# Patient Record
Sex: Female | Born: 1945 | Hispanic: No
Health system: Midwestern US, Community
[De-identification: ages and names within clinical notes are randomized; demographics above are authoritative.]

## PROBLEM LIST (undated history)

## (undated) DIAGNOSIS — M1990 Unspecified osteoarthritis, unspecified site: Secondary | ICD-10-CM

## (undated) DIAGNOSIS — F32A Depression, unspecified: Secondary | ICD-10-CM

## (undated) DIAGNOSIS — R7303 Prediabetes: Secondary | ICD-10-CM

## (undated) DIAGNOSIS — G4730 Sleep apnea, unspecified: Secondary | ICD-10-CM

## (undated) DIAGNOSIS — J45909 Unspecified asthma, uncomplicated: Secondary | ICD-10-CM

## (undated) DIAGNOSIS — E039 Hypothyroidism, unspecified: Secondary | ICD-10-CM

## (undated) DIAGNOSIS — M810 Age-related osteoporosis without current pathological fracture: Secondary | ICD-10-CM

## (undated) DIAGNOSIS — K219 Gastro-esophageal reflux disease without esophagitis: Secondary | ICD-10-CM

## (undated) DIAGNOSIS — F329 Major depressive disorder, single episode, unspecified: Secondary | ICD-10-CM

## (undated) DIAGNOSIS — I10 Essential (primary) hypertension: Secondary | ICD-10-CM

## (undated) DIAGNOSIS — F419 Anxiety disorder, unspecified: Secondary | ICD-10-CM

## (undated) DIAGNOSIS — E079 Disorder of thyroid, unspecified: Secondary | ICD-10-CM

## (undated) DIAGNOSIS — Z8489 Family history of other specified conditions: Secondary | ICD-10-CM

## (undated) DIAGNOSIS — N390 Urinary tract infection, site not specified: Secondary | ICD-10-CM

## (undated) HISTORY — DX: Disorder of thyroid, unspecified: E07.9

## (undated) HISTORY — DX: Essential (primary) hypertension: I10

## (undated) HISTORY — PX: JOINT REPLACEMENT: SHX530

## (undated) HISTORY — PX: BREAST BIOPSY: SHX20

## (undated) HISTORY — PX: EYE SURGERY: SHX253

## (undated) HISTORY — PX: KNEE SURGERY: SHX244

## (undated) HISTORY — DX: Major depressive disorder, single episode, unspecified: F32.9

## (undated) HISTORY — DX: Depression, unspecified: F32.A

## (undated) HISTORY — PX: CARPAL TUNNEL RELEASE: SHX101

## (undated) HISTORY — DX: Age-related osteoporosis without current pathological fracture: M81.0

## (undated) HISTORY — DX: Unspecified osteoarthritis, unspecified site: M19.90

## (undated) HISTORY — DX: Gastro-esophageal reflux disease without esophagitis: K21.9

---

## 1973-12-18 HISTORY — PX: PARTIAL HYSTERECTOMY: SHX80

## 2007-09-09 ENCOUNTER — Ambulatory Visit: Payer: Self-pay | Admitting: Internal Medicine

## 2011-01-02 LAB — BASIC METABOLIC PANEL
BUN: 10 mg/dL (ref 6–23)
CO2: 26 mEq/L (ref 19–32)
Calcium: 9 mg/dL (ref 8.4–10.5)
Chloride: 102 mEq/L (ref 96–112)
Creatinine, Ser: 0.95 mg/dL (ref 0.4–1.2)
GFR calc Af Amer: >60 mL/min (ref >60)
GFR calc non Af Amer: 59 mL/min — ABNORMAL LOW (ref >60)
Glucose, Bld: 99 mg/dL (ref 70–99)
Potassium: 3.9 mEq/L (ref 3.5–5.1)
Sodium: 137 mEq/L (ref 135–145)

## 2011-01-02 LAB — SURGICAL PCR SCREEN
MRSA, PCR: NEGATIVE
Staphylococcus aureus: NEGATIVE

## 2011-01-02 LAB — HEMOGLOBIN AND HEMATOCRIT, BLOOD
HCT: 37.1 % (ref 36.0–46.0)
Hemoglobin: 13.1 g/dL (ref 12.0–15.0)

## 2011-01-05 ENCOUNTER — Ambulatory Visit (HOSPITAL_COMMUNITY)
Admission: RE | Admit: 2011-01-05 | Discharge: 2011-01-05 | Payer: Self-pay | Source: Home / Self Care | Attending: Podiatry | Admitting: Podiatry

## 2011-02-11 NOTE — Op Note (Signed)
  NAME:  Sherri Sandoval, Sherri Sandoval          ACCOUNT NO.:  [id number]  MEDICAL RECORD NO.:  [id number]          PATIENT TYPE:  AMB  LOCATION:  DAY                           FACILITY:  [facility]  PHYSICIAN:  B. Theola Sequin, MD   DATE OF BIRTH:  [birth year]  DATE OF PROCEDURE:  01/13/2011 DATE OF DISCHARGE:  01/05/2011                              OPERATIVE REPORT   SURGEON:  B. Theola Sequin, MD  ASSISTANT:  None.  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  PROCEDURE:  Retrocalcaneal exostectomy of the left lower extremity.  ANESTHESIA:  General.  HEMOSTASIS:  Pneumatic thigh tourniquet at 300 mmHg.  ESTIMATED BLOOD LOSS:  Minimal (less than 5 mL).  PATHOLOGY:  Bone/exostosis from the posterior aspect of the calcaneus, left foot.  COMPLICATIONS:  None.  PROCEDURE IN DETAIL:  The patient was brought to the operating room. After general anesthesia was obtained, she was placed in a modified prone position. Pneumatic thigh tourniquet was placed about the patient's left thigh.  The foot was scrubbed, prepped and draped in usual sterile manner.  The limb was then elevated, exsanguinated.  The pneumatic thigh tourniquet inflated to 300 mmHg.  A linear longitudinal incision was made along the posterior aspect of the left lower extremity.  Incision was continued deep down to the level of the Achilles tendon.  The was thickening of the Achilles tendon along its insertion point to the posterior aspect of the calcaneus.  A longitudinal incision was made in the central part of the incision and this was reflected, thus exposing the retrocalcaneal exostosis at the operative site.  A intra- tendinous area of ossification was noted and this was removed and sent to Pathology for evaluation.  Wound was irrigated with copious amounts of sterile irrigant.  The prominent posterior spur  was resected using  a power bone saw and smoothed with a power rasp. The deformity was assessed with C-arm fluoroscopy.  An Arthrex 3.5-mm corkscrew soft tissue anchors was inserted along the posterior aspect of the calcaneus.  Corresponding suture from the soft tissue anchor was used to secure the Achilles tendon to the posterior aspect of the calcaneus.  The wound was again irrigated with copious amounts of sterile irrigant.  Peritenon and subcutaneous structure were reapproximated using Vicryl.  Skin was reapproximated using skin staples.  Sterile compressive dressing was applied to the left lower extremity and pneumatic thigh tourniquet was deflated and prompt hyperemic response was noted to all digits of the left foot.  A posterior splint was applied to the left lower extremity.          ______________________________ B. Theola Sequin, MD     BIM/MEDQ  D:  01/13/2011  T:  01/14/2011  Job:  [id number]  Electronically Signed by Rexford Maus  on 02/11/2011 11:12:54 AM

## 2011-05-02 NOTE — Assessment & Plan Note (Signed)
[facility]                             PULMONARY OFFICE NOTE   Sherri Sandoval, Sherri Sandoval                   MRN:          [id number]  DATE:09/09/2007                            DOB:          May 16, 1946    A 65 year old white female with morbid obesity and new-onset cough in  2005.  Ever since her initial exacerbation, which led to  hospitalization, she has had coughing spells pretty much ever day.  These occur immediately on lying down, but are also off and on during  the day and are more dry than wet.  On her previous hospitalization in  2005, she was actually diagnosed with asthma, but after she ran out of  Advair found that her symptoms were no better and no worse.  She has  been treated with a course of prednisone at this point for her most  recent exacerbation, which started a week ago, but is feeling no  better.  She denies any excess sputum production, fevers, chills,  sweats, overt sinus or reflux symptoms.   PAST MEDICAL HISTORY:  Significant for obesity complicated by  hypertension, hyperlipidemia, and sleep apnea.  She is status post  remote hysterectomy.   ALLERGIES:  None known.   MEDICATIONS:  Advair.  Flonase.  Exforge.  Simvastatin.   SOCIAL HISTORY:  She has never smoked.  She does clerical work.   FAMILY HISTORY:  Significant for allergies in her sister.   REVIEW OF SYSTEMS:  Taken in detail and significant for the problems as  outlined above.  She does note occasional dysphagia.  Occasionally does  have acid reflux symptoms, but does not correlate these with her cough.   PHYSICAL EXAMINATION:  This is a hoarse ambulatory white female in no  acute distress.  She is afebrile with stable vital signs.  HEENT:  Unremarkable.  Oropharynx clear.  NECK:  Supple without cervical adenopathy or tenderness.  Trachea is  midline.  No thyromegaly.  No palpable thyroid nodules.  LUNGS:  The lung fields are perfectly clear bilaterally to  auscultation  and percussion.  CARDIAC:  Regular rate and rhythm without murmur, gallop, or rub.  ABDOMEN:  Soft and benign.  EXTREMITIES:  Warm without calf tenderness, cyanosis, clubbing, or  edema.   A CT scan of the chest was reviewed from August 30, 2007 and shows a  small hiatal hernia, and also a 1 cm nodule in the left lower lobe of  the thyroid gland.   IMPRESSION:  Classic upper airway cough syndrome, probably related to  reflux, which she does have overtly, although has not connected the dots  in terms of cause and effect.  At this point, I therefore recommended  rigorous treatment directed at reflux in the form of Zegerid 40 mg  nightly.  Stop the Advair (since she did not seem to respond to it and  did not previously flare off of it), and reviewed with her very  carefully the intent of a gastroesophageal reflux disease diet.   If she is still coughing, I recommended she take Delsym 2 teaspoon every  12 hours supplemented with tramadol  every 4 hours.  If this totally  eliminates her symptoms, then she can certainly get the refills there in  [place].  Otherwise, I need to see her back here in 4 weeks.     Charlaine Dalton. Sherene Sires, MD, [facility]  Electronically Signed    MBW/MedQ  DD: 09/09/2007  DT: 09/10/2007  Job #: [id number]   cc:   Birdie Sons. Ike Bene, M.D.

## 2011-12-27 DIAGNOSIS — Z780 Asymptomatic menopausal state: Secondary | ICD-10-CM | POA: Diagnosis not present

## 2011-12-27 DIAGNOSIS — N8111 Cystocele, midline: Secondary | ICD-10-CM | POA: Diagnosis not present

## 2011-12-27 DIAGNOSIS — E669 Obesity, unspecified: Secondary | ICD-10-CM | POA: Diagnosis not present

## 2011-12-27 DIAGNOSIS — Z1289 Encounter for screening for malignant neoplasm of other sites: Secondary | ICD-10-CM | POA: Diagnosis not present

## 2011-12-27 DIAGNOSIS — N952 Postmenopausal atrophic vaginitis: Secondary | ICD-10-CM | POA: Diagnosis not present

## 2011-12-27 DIAGNOSIS — Z1212 Encounter for screening for malignant neoplasm of rectum: Secondary | ICD-10-CM | POA: Diagnosis not present

## 2012-01-23 DIAGNOSIS — E039 Hypothyroidism, unspecified: Secondary | ICD-10-CM | POA: Diagnosis not present

## 2012-01-23 DIAGNOSIS — I10 Essential (primary) hypertension: Secondary | ICD-10-CM | POA: Diagnosis not present

## 2012-01-23 DIAGNOSIS — E7800 Pure hypercholesterolemia, unspecified: Secondary | ICD-10-CM | POA: Diagnosis not present

## 2012-01-23 DIAGNOSIS — R7309 Other abnormal glucose: Secondary | ICD-10-CM | POA: Diagnosis not present

## 2012-01-30 DIAGNOSIS — E669 Obesity, unspecified: Secondary | ICD-10-CM | POA: Diagnosis not present

## 2012-01-30 DIAGNOSIS — G2581 Restless legs syndrome: Secondary | ICD-10-CM | POA: Diagnosis not present

## 2012-01-30 DIAGNOSIS — J45902 Unspecified asthma with status asthmaticus: Secondary | ICD-10-CM | POA: Diagnosis not present

## 2012-01-30 DIAGNOSIS — R609 Edema, unspecified: Secondary | ICD-10-CM | POA: Diagnosis not present

## 2012-01-30 DIAGNOSIS — K219 Gastro-esophageal reflux disease without esophagitis: Secondary | ICD-10-CM | POA: Diagnosis not present

## 2012-01-30 DIAGNOSIS — M129 Arthropathy, unspecified: Secondary | ICD-10-CM | POA: Diagnosis not present

## 2012-01-30 DIAGNOSIS — E039 Hypothyroidism, unspecified: Secondary | ICD-10-CM | POA: Diagnosis not present

## 2012-01-30 DIAGNOSIS — I10 Essential (primary) hypertension: Secondary | ICD-10-CM | POA: Diagnosis not present

## 2012-02-15 DIAGNOSIS — R143 Flatulence: Secondary | ICD-10-CM | POA: Diagnosis not present

## 2012-02-15 DIAGNOSIS — K219 Gastro-esophageal reflux disease without esophagitis: Secondary | ICD-10-CM | POA: Diagnosis not present

## 2012-02-15 DIAGNOSIS — R141 Gas pain: Secondary | ICD-10-CM | POA: Diagnosis not present

## 2012-02-15 DIAGNOSIS — Z86010 Personal history of colonic polyps: Secondary | ICD-10-CM | POA: Diagnosis not present

## 2012-02-15 DIAGNOSIS — K5909 Other constipation: Secondary | ICD-10-CM | POA: Diagnosis not present

## 2012-04-02 DIAGNOSIS — R142 Eructation: Secondary | ICD-10-CM | POA: Diagnosis not present

## 2012-04-02 DIAGNOSIS — Z86010 Personal history of colonic polyps: Secondary | ICD-10-CM | POA: Diagnosis not present

## 2012-04-02 DIAGNOSIS — R141 Gas pain: Secondary | ICD-10-CM | POA: Diagnosis not present

## 2012-04-02 DIAGNOSIS — K5909 Other constipation: Secondary | ICD-10-CM | POA: Diagnosis not present

## 2012-04-02 DIAGNOSIS — K219 Gastro-esophageal reflux disease without esophagitis: Secondary | ICD-10-CM | POA: Diagnosis not present

## 2012-04-15 DIAGNOSIS — Z961 Presence of intraocular lens: Secondary | ICD-10-CM | POA: Diagnosis not present

## 2012-04-15 DIAGNOSIS — H40019 Open angle with borderline findings, low risk, unspecified eye: Secondary | ICD-10-CM | POA: Diagnosis not present

## 2012-04-15 DIAGNOSIS — H26499 Other secondary cataract, unspecified eye: Secondary | ICD-10-CM | POA: Diagnosis not present

## 2012-04-18 DIAGNOSIS — L280 Lichen simplex chronicus: Secondary | ICD-10-CM | POA: Diagnosis not present

## 2012-04-18 DIAGNOSIS — L719 Rosacea, unspecified: Secondary | ICD-10-CM | POA: Diagnosis not present

## 2012-04-18 DIAGNOSIS — D235 Other benign neoplasm of skin of trunk: Secondary | ICD-10-CM | POA: Diagnosis not present

## 2012-07-22 DIAGNOSIS — S13161A Dislocation of C5/C6 cervical vertebrae, initial encounter: Secondary | ICD-10-CM | POA: Diagnosis not present

## 2012-07-22 DIAGNOSIS — S332XXA Dislocation of sacroiliac and sacrococcygeal joint, initial encounter: Secondary | ICD-10-CM | POA: Diagnosis not present

## 2012-07-22 DIAGNOSIS — M999 Biomechanical lesion, unspecified: Secondary | ICD-10-CM | POA: Diagnosis not present

## 2012-07-22 DIAGNOSIS — M9981 Other biomechanical lesions of cervical region: Secondary | ICD-10-CM | POA: Diagnosis not present

## 2012-07-24 DIAGNOSIS — E782 Mixed hyperlipidemia: Secondary | ICD-10-CM | POA: Diagnosis not present

## 2012-07-24 DIAGNOSIS — I10 Essential (primary) hypertension: Secondary | ICD-10-CM | POA: Diagnosis not present

## 2012-07-24 DIAGNOSIS — E039 Hypothyroidism, unspecified: Secondary | ICD-10-CM | POA: Diagnosis not present

## 2012-07-29 DIAGNOSIS — S13161A Dislocation of C5/C6 cervical vertebrae, initial encounter: Secondary | ICD-10-CM | POA: Diagnosis not present

## 2012-07-29 DIAGNOSIS — M999 Biomechanical lesion, unspecified: Secondary | ICD-10-CM | POA: Diagnosis not present

## 2012-07-29 DIAGNOSIS — S332XXA Dislocation of sacroiliac and sacrococcygeal joint, initial encounter: Secondary | ICD-10-CM | POA: Diagnosis not present

## 2012-07-29 DIAGNOSIS — M9981 Other biomechanical lesions of cervical region: Secondary | ICD-10-CM | POA: Diagnosis not present

## 2012-07-31 DIAGNOSIS — G2581 Restless legs syndrome: Secondary | ICD-10-CM | POA: Diagnosis not present

## 2012-07-31 DIAGNOSIS — M129 Arthropathy, unspecified: Secondary | ICD-10-CM | POA: Diagnosis not present

## 2012-07-31 DIAGNOSIS — I10 Essential (primary) hypertension: Secondary | ICD-10-CM | POA: Diagnosis not present

## 2012-07-31 DIAGNOSIS — E669 Obesity, unspecified: Secondary | ICD-10-CM | POA: Diagnosis not present

## 2012-07-31 DIAGNOSIS — E039 Hypothyroidism, unspecified: Secondary | ICD-10-CM | POA: Diagnosis not present

## 2012-07-31 DIAGNOSIS — K219 Gastro-esophageal reflux disease without esophagitis: Secondary | ICD-10-CM | POA: Diagnosis not present

## 2012-07-31 DIAGNOSIS — R609 Edema, unspecified: Secondary | ICD-10-CM | POA: Diagnosis not present

## 2012-07-31 DIAGNOSIS — J45902 Unspecified asthma with status asthmaticus: Secondary | ICD-10-CM | POA: Diagnosis not present

## 2012-08-01 DIAGNOSIS — S13161A Dislocation of C5/C6 cervical vertebrae, initial encounter: Secondary | ICD-10-CM | POA: Diagnosis not present

## 2012-08-01 DIAGNOSIS — S332XXA Dislocation of sacroiliac and sacrococcygeal joint, initial encounter: Secondary | ICD-10-CM | POA: Diagnosis not present

## 2012-08-01 DIAGNOSIS — M999 Biomechanical lesion, unspecified: Secondary | ICD-10-CM | POA: Diagnosis not present

## 2012-08-01 DIAGNOSIS — M9981 Other biomechanical lesions of cervical region: Secondary | ICD-10-CM | POA: Diagnosis not present

## 2012-08-07 DIAGNOSIS — M9981 Other biomechanical lesions of cervical region: Secondary | ICD-10-CM | POA: Diagnosis not present

## 2012-08-07 DIAGNOSIS — M999 Biomechanical lesion, unspecified: Secondary | ICD-10-CM | POA: Diagnosis not present

## 2012-08-07 DIAGNOSIS — S332XXA Dislocation of sacroiliac and sacrococcygeal joint, initial encounter: Secondary | ICD-10-CM | POA: Diagnosis not present

## 2012-08-07 DIAGNOSIS — S13161A Dislocation of C5/C6 cervical vertebrae, initial encounter: Secondary | ICD-10-CM | POA: Diagnosis not present

## 2012-08-08 DIAGNOSIS — M9981 Other biomechanical lesions of cervical region: Secondary | ICD-10-CM | POA: Diagnosis not present

## 2012-08-08 DIAGNOSIS — S13161A Dislocation of C5/C6 cervical vertebrae, initial encounter: Secondary | ICD-10-CM | POA: Diagnosis not present

## 2012-08-08 DIAGNOSIS — M999 Biomechanical lesion, unspecified: Secondary | ICD-10-CM | POA: Diagnosis not present

## 2012-08-08 DIAGNOSIS — S332XXA Dislocation of sacroiliac and sacrococcygeal joint, initial encounter: Secondary | ICD-10-CM | POA: Diagnosis not present

## 2012-08-12 DIAGNOSIS — M999 Biomechanical lesion, unspecified: Secondary | ICD-10-CM | POA: Diagnosis not present

## 2012-08-12 DIAGNOSIS — S332XXA Dislocation of sacroiliac and sacrococcygeal joint, initial encounter: Secondary | ICD-10-CM | POA: Diagnosis not present

## 2012-08-12 DIAGNOSIS — M9981 Other biomechanical lesions of cervical region: Secondary | ICD-10-CM | POA: Diagnosis not present

## 2012-08-12 DIAGNOSIS — S13161A Dislocation of C5/C6 cervical vertebrae, initial encounter: Secondary | ICD-10-CM | POA: Diagnosis not present

## 2012-08-22 DIAGNOSIS — M999 Biomechanical lesion, unspecified: Secondary | ICD-10-CM | POA: Diagnosis not present

## 2012-08-22 DIAGNOSIS — M9981 Other biomechanical lesions of cervical region: Secondary | ICD-10-CM | POA: Diagnosis not present

## 2012-08-22 DIAGNOSIS — S13161A Dislocation of C5/C6 cervical vertebrae, initial encounter: Secondary | ICD-10-CM | POA: Diagnosis not present

## 2012-08-22 DIAGNOSIS — S332XXA Dislocation of sacroiliac and sacrococcygeal joint, initial encounter: Secondary | ICD-10-CM | POA: Diagnosis not present

## 2012-08-28 DIAGNOSIS — M999 Biomechanical lesion, unspecified: Secondary | ICD-10-CM | POA: Diagnosis not present

## 2012-08-28 DIAGNOSIS — S13161A Dislocation of C5/C6 cervical vertebrae, initial encounter: Secondary | ICD-10-CM | POA: Diagnosis not present

## 2012-08-28 DIAGNOSIS — S332XXA Dislocation of sacroiliac and sacrococcygeal joint, initial encounter: Secondary | ICD-10-CM | POA: Diagnosis not present

## 2012-08-28 DIAGNOSIS — M9981 Other biomechanical lesions of cervical region: Secondary | ICD-10-CM | POA: Diagnosis not present

## 2012-09-03 DIAGNOSIS — J309 Allergic rhinitis, unspecified: Secondary | ICD-10-CM | POA: Diagnosis not present

## 2012-09-03 DIAGNOSIS — G4733 Obstructive sleep apnea (adult) (pediatric): Secondary | ICD-10-CM | POA: Diagnosis not present

## 2012-09-03 DIAGNOSIS — G2581 Restless legs syndrome: Secondary | ICD-10-CM | POA: Diagnosis not present

## 2012-09-03 DIAGNOSIS — Z23 Encounter for immunization: Secondary | ICD-10-CM | POA: Diagnosis not present

## 2012-09-03 DIAGNOSIS — G4700 Insomnia, unspecified: Secondary | ICD-10-CM | POA: Diagnosis not present

## 2012-09-03 DIAGNOSIS — R062 Wheezing: Secondary | ICD-10-CM | POA: Diagnosis not present

## 2012-09-11 DIAGNOSIS — M999 Biomechanical lesion, unspecified: Secondary | ICD-10-CM | POA: Diagnosis not present

## 2012-09-11 DIAGNOSIS — S332XXA Dislocation of sacroiliac and sacrococcygeal joint, initial encounter: Secondary | ICD-10-CM | POA: Diagnosis not present

## 2012-09-11 DIAGNOSIS — S13161A Dislocation of C5/C6 cervical vertebrae, initial encounter: Secondary | ICD-10-CM | POA: Diagnosis not present

## 2012-09-11 DIAGNOSIS — M9981 Other biomechanical lesions of cervical region: Secondary | ICD-10-CM | POA: Diagnosis not present

## 2012-10-02 DIAGNOSIS — S13161A Dislocation of C5/C6 cervical vertebrae, initial encounter: Secondary | ICD-10-CM | POA: Diagnosis not present

## 2012-10-02 DIAGNOSIS — M999 Biomechanical lesion, unspecified: Secondary | ICD-10-CM | POA: Diagnosis not present

## 2012-10-02 DIAGNOSIS — M9981 Other biomechanical lesions of cervical region: Secondary | ICD-10-CM | POA: Diagnosis not present

## 2012-10-02 DIAGNOSIS — S332XXA Dislocation of sacroiliac and sacrococcygeal joint, initial encounter: Secondary | ICD-10-CM | POA: Diagnosis not present

## 2012-10-03 DIAGNOSIS — R142 Eructation: Secondary | ICD-10-CM | POA: Diagnosis not present

## 2012-10-03 DIAGNOSIS — K5909 Other constipation: Secondary | ICD-10-CM | POA: Diagnosis not present

## 2012-10-03 DIAGNOSIS — R1310 Dysphagia, unspecified: Secondary | ICD-10-CM | POA: Diagnosis not present

## 2012-10-03 DIAGNOSIS — R141 Gas pain: Secondary | ICD-10-CM | POA: Diagnosis not present

## 2012-10-03 DIAGNOSIS — K219 Gastro-esophageal reflux disease without esophagitis: Secondary | ICD-10-CM | POA: Diagnosis not present

## 2012-10-10 DIAGNOSIS — R1310 Dysphagia, unspecified: Secondary | ICD-10-CM | POA: Diagnosis not present

## 2012-10-16 DIAGNOSIS — S13161A Dislocation of C5/C6 cervical vertebrae, initial encounter: Secondary | ICD-10-CM | POA: Diagnosis not present

## 2012-10-16 DIAGNOSIS — S332XXA Dislocation of sacroiliac and sacrococcygeal joint, initial encounter: Secondary | ICD-10-CM | POA: Diagnosis not present

## 2012-10-16 DIAGNOSIS — M999 Biomechanical lesion, unspecified: Secondary | ICD-10-CM | POA: Diagnosis not present

## 2012-10-16 DIAGNOSIS — M9981 Other biomechanical lesions of cervical region: Secondary | ICD-10-CM | POA: Diagnosis not present

## 2012-10-31 DIAGNOSIS — R143 Flatulence: Secondary | ICD-10-CM | POA: Diagnosis not present

## 2012-10-31 DIAGNOSIS — R1310 Dysphagia, unspecified: Secondary | ICD-10-CM | POA: Diagnosis not present

## 2012-10-31 DIAGNOSIS — K5909 Other constipation: Secondary | ICD-10-CM | POA: Diagnosis not present

## 2012-10-31 DIAGNOSIS — R141 Gas pain: Secondary | ICD-10-CM | POA: Diagnosis not present

## 2012-10-31 DIAGNOSIS — K219 Gastro-esophageal reflux disease without esophagitis: Secondary | ICD-10-CM | POA: Diagnosis not present

## 2012-11-06 DIAGNOSIS — S332XXA Dislocation of sacroiliac and sacrococcygeal joint, initial encounter: Secondary | ICD-10-CM | POA: Diagnosis not present

## 2012-11-06 DIAGNOSIS — S13161A Dislocation of C5/C6 cervical vertebrae, initial encounter: Secondary | ICD-10-CM | POA: Diagnosis not present

## 2012-11-06 DIAGNOSIS — M9981 Other biomechanical lesions of cervical region: Secondary | ICD-10-CM | POA: Diagnosis not present

## 2012-11-06 DIAGNOSIS — M999 Biomechanical lesion, unspecified: Secondary | ICD-10-CM | POA: Diagnosis not present

## 2012-11-18 DIAGNOSIS — K2960 Other gastritis without bleeding: Secondary | ICD-10-CM | POA: Diagnosis not present

## 2012-11-18 DIAGNOSIS — K2970 Gastritis, unspecified, without bleeding: Secondary | ICD-10-CM | POA: Diagnosis not present

## 2012-11-18 DIAGNOSIS — K219 Gastro-esophageal reflux disease without esophagitis: Secondary | ICD-10-CM | POA: Diagnosis not present

## 2012-11-18 DIAGNOSIS — I10 Essential (primary) hypertension: Secondary | ICD-10-CM | POA: Diagnosis not present

## 2012-11-18 DIAGNOSIS — R1310 Dysphagia, unspecified: Secondary | ICD-10-CM | POA: Diagnosis not present

## 2012-11-18 DIAGNOSIS — E669 Obesity, unspecified: Secondary | ICD-10-CM | POA: Diagnosis not present

## 2012-11-18 DIAGNOSIS — K2990 Gastroduodenitis, unspecified, without bleeding: Secondary | ICD-10-CM | POA: Diagnosis not present

## 2012-11-18 DIAGNOSIS — K449 Diaphragmatic hernia without obstruction or gangrene: Secondary | ICD-10-CM | POA: Diagnosis not present

## 2012-11-25 DIAGNOSIS — M999 Biomechanical lesion, unspecified: Secondary | ICD-10-CM | POA: Diagnosis not present

## 2012-11-25 DIAGNOSIS — M9981 Other biomechanical lesions of cervical region: Secondary | ICD-10-CM | POA: Diagnosis not present

## 2012-11-25 DIAGNOSIS — S332XXA Dislocation of sacroiliac and sacrococcygeal joint, initial encounter: Secondary | ICD-10-CM | POA: Diagnosis not present

## 2012-11-25 DIAGNOSIS — S13161A Dislocation of C5/C6 cervical vertebrae, initial encounter: Secondary | ICD-10-CM | POA: Diagnosis not present

## 2012-12-23 DIAGNOSIS — S13161A Dislocation of C5/C6 cervical vertebrae, initial encounter: Secondary | ICD-10-CM | POA: Diagnosis not present

## 2012-12-23 DIAGNOSIS — M9981 Other biomechanical lesions of cervical region: Secondary | ICD-10-CM | POA: Diagnosis not present

## 2012-12-23 DIAGNOSIS — M999 Biomechanical lesion, unspecified: Secondary | ICD-10-CM | POA: Diagnosis not present

## 2012-12-23 DIAGNOSIS — S332XXA Dislocation of sacroiliac and sacrococcygeal joint, initial encounter: Secondary | ICD-10-CM | POA: Diagnosis not present

## 2012-12-31 DIAGNOSIS — R142 Eructation: Secondary | ICD-10-CM | POA: Diagnosis not present

## 2012-12-31 DIAGNOSIS — K5909 Other constipation: Secondary | ICD-10-CM | POA: Diagnosis not present

## 2012-12-31 DIAGNOSIS — R143 Flatulence: Secondary | ICD-10-CM | POA: Diagnosis not present

## 2012-12-31 DIAGNOSIS — R141 Gas pain: Secondary | ICD-10-CM | POA: Diagnosis not present

## 2013-01-01 DIAGNOSIS — Z1212 Encounter for screening for malignant neoplasm of rectum: Secondary | ICD-10-CM | POA: Diagnosis not present

## 2013-01-01 DIAGNOSIS — N8111 Cystocele, midline: Secondary | ICD-10-CM | POA: Diagnosis not present

## 2013-01-01 DIAGNOSIS — N952 Postmenopausal atrophic vaginitis: Secondary | ICD-10-CM | POA: Diagnosis not present

## 2013-01-01 DIAGNOSIS — Z1231 Encounter for screening mammogram for malignant neoplasm of breast: Secondary | ICD-10-CM | POA: Diagnosis not present

## 2013-01-01 DIAGNOSIS — Z780 Asymptomatic menopausal state: Secondary | ICD-10-CM | POA: Diagnosis not present

## 2013-01-20 DIAGNOSIS — H26499 Other secondary cataract, unspecified eye: Secondary | ICD-10-CM | POA: Diagnosis not present

## 2013-01-20 DIAGNOSIS — H40019 Open angle with borderline findings, low risk, unspecified eye: Secondary | ICD-10-CM | POA: Diagnosis not present

## 2013-01-20 DIAGNOSIS — Z961 Presence of intraocular lens: Secondary | ICD-10-CM | POA: Diagnosis not present

## 2013-01-24 DIAGNOSIS — E039 Hypothyroidism, unspecified: Secondary | ICD-10-CM | POA: Diagnosis not present

## 2013-01-24 DIAGNOSIS — R7309 Other abnormal glucose: Secondary | ICD-10-CM | POA: Diagnosis not present

## 2013-01-24 DIAGNOSIS — I10 Essential (primary) hypertension: Secondary | ICD-10-CM | POA: Diagnosis not present

## 2013-01-24 DIAGNOSIS — K219 Gastro-esophageal reflux disease without esophagitis: Secondary | ICD-10-CM | POA: Diagnosis not present

## 2013-02-04 DIAGNOSIS — G2581 Restless legs syndrome: Secondary | ICD-10-CM | POA: Diagnosis not present

## 2013-02-04 DIAGNOSIS — E039 Hypothyroidism, unspecified: Secondary | ICD-10-CM | POA: Diagnosis not present

## 2013-02-04 DIAGNOSIS — E669 Obesity, unspecified: Secondary | ICD-10-CM | POA: Diagnosis not present

## 2013-02-04 DIAGNOSIS — K219 Gastro-esophageal reflux disease without esophagitis: Secondary | ICD-10-CM | POA: Diagnosis not present

## 2013-02-04 DIAGNOSIS — I10 Essential (primary) hypertension: Secondary | ICD-10-CM | POA: Diagnosis not present

## 2013-02-04 DIAGNOSIS — J45902 Unspecified asthma with status asthmaticus: Secondary | ICD-10-CM | POA: Diagnosis not present

## 2013-02-04 DIAGNOSIS — R609 Edema, unspecified: Secondary | ICD-10-CM | POA: Diagnosis not present

## 2013-02-04 DIAGNOSIS — M129 Arthropathy, unspecified: Secondary | ICD-10-CM | POA: Diagnosis not present

## 2013-03-20 DIAGNOSIS — Z13820 Encounter for screening for osteoporosis: Secondary | ICD-10-CM | POA: Diagnosis not present

## 2013-04-09 DIAGNOSIS — G4700 Insomnia, unspecified: Secondary | ICD-10-CM | POA: Diagnosis not present

## 2013-04-09 DIAGNOSIS — G2581 Restless legs syndrome: Secondary | ICD-10-CM | POA: Diagnosis not present

## 2013-04-09 DIAGNOSIS — G4733 Obstructive sleep apnea (adult) (pediatric): Secondary | ICD-10-CM | POA: Diagnosis not present

## 2013-04-09 DIAGNOSIS — J309 Allergic rhinitis, unspecified: Secondary | ICD-10-CM | POA: Diagnosis not present

## 2013-06-04 DIAGNOSIS — G2581 Restless legs syndrome: Secondary | ICD-10-CM | POA: Diagnosis not present

## 2013-06-09 DIAGNOSIS — G4733 Obstructive sleep apnea (adult) (pediatric): Secondary | ICD-10-CM | POA: Diagnosis not present

## 2013-06-09 DIAGNOSIS — G4700 Insomnia, unspecified: Secondary | ICD-10-CM | POA: Diagnosis not present

## 2013-06-09 DIAGNOSIS — IMO0002 Reserved for concepts with insufficient information to code with codable children: Secondary | ICD-10-CM | POA: Diagnosis not present

## 2013-07-29 DIAGNOSIS — J45902 Unspecified asthma with status asthmaticus: Secondary | ICD-10-CM | POA: Diagnosis not present

## 2013-07-29 DIAGNOSIS — K219 Gastro-esophageal reflux disease without esophagitis: Secondary | ICD-10-CM | POA: Diagnosis not present

## 2013-07-29 DIAGNOSIS — E669 Obesity, unspecified: Secondary | ICD-10-CM | POA: Diagnosis not present

## 2013-07-29 DIAGNOSIS — E782 Mixed hyperlipidemia: Secondary | ICD-10-CM | POA: Diagnosis not present

## 2013-07-29 DIAGNOSIS — E039 Hypothyroidism, unspecified: Secondary | ICD-10-CM | POA: Diagnosis not present

## 2013-07-29 DIAGNOSIS — M129 Arthropathy, unspecified: Secondary | ICD-10-CM | POA: Diagnosis not present

## 2013-07-29 DIAGNOSIS — I10 Essential (primary) hypertension: Secondary | ICD-10-CM | POA: Diagnosis not present

## 2013-07-29 DIAGNOSIS — G2581 Restless legs syndrome: Secondary | ICD-10-CM | POA: Diagnosis not present

## 2013-08-05 DIAGNOSIS — E039 Hypothyroidism, unspecified: Secondary | ICD-10-CM | POA: Diagnosis not present

## 2013-08-05 DIAGNOSIS — G2581 Restless legs syndrome: Secondary | ICD-10-CM | POA: Diagnosis not present

## 2013-08-05 DIAGNOSIS — M129 Arthropathy, unspecified: Secondary | ICD-10-CM | POA: Diagnosis not present

## 2013-08-05 DIAGNOSIS — K219 Gastro-esophageal reflux disease without esophagitis: Secondary | ICD-10-CM | POA: Diagnosis not present

## 2013-08-05 DIAGNOSIS — J45902 Unspecified asthma with status asthmaticus: Secondary | ICD-10-CM | POA: Diagnosis not present

## 2013-08-05 DIAGNOSIS — E669 Obesity, unspecified: Secondary | ICD-10-CM | POA: Diagnosis not present

## 2013-08-05 DIAGNOSIS — R609 Edema, unspecified: Secondary | ICD-10-CM | POA: Diagnosis not present

## 2013-08-05 DIAGNOSIS — I10 Essential (primary) hypertension: Secondary | ICD-10-CM | POA: Diagnosis not present

## 2013-08-22 DIAGNOSIS — G4733 Obstructive sleep apnea (adult) (pediatric): Secondary | ICD-10-CM | POA: Diagnosis not present

## 2013-08-22 DIAGNOSIS — G4752 REM sleep behavior disorder: Secondary | ICD-10-CM | POA: Diagnosis not present

## 2013-09-05 DIAGNOSIS — D1739 Benign lipomatous neoplasm of skin and subcutaneous tissue of other sites: Secondary | ICD-10-CM | POA: Diagnosis not present

## 2013-09-05 DIAGNOSIS — Z711 Person with feared health complaint in whom no diagnosis is made: Secondary | ICD-10-CM | POA: Diagnosis not present

## 2013-09-11 DIAGNOSIS — Z23 Encounter for immunization: Secondary | ICD-10-CM | POA: Diagnosis not present

## 2013-09-17 DIAGNOSIS — G4733 Obstructive sleep apnea (adult) (pediatric): Secondary | ICD-10-CM | POA: Diagnosis not present

## 2013-09-19 DIAGNOSIS — D485 Neoplasm of uncertain behavior of skin: Secondary | ICD-10-CM | POA: Diagnosis not present

## 2013-09-19 DIAGNOSIS — B079 Viral wart, unspecified: Secondary | ICD-10-CM | POA: Diagnosis not present

## 2013-09-29 DIAGNOSIS — G4769 Other sleep related movement disorders: Secondary | ICD-10-CM | POA: Diagnosis not present

## 2013-09-29 DIAGNOSIS — G4752 REM sleep behavior disorder: Secondary | ICD-10-CM | POA: Diagnosis not present

## 2013-09-30 DIAGNOSIS — G4733 Obstructive sleep apnea (adult) (pediatric): Secondary | ICD-10-CM | POA: Diagnosis not present

## 2013-10-09 DIAGNOSIS — D485 Neoplasm of uncertain behavior of skin: Secondary | ICD-10-CM | POA: Diagnosis not present

## 2013-10-10 DIAGNOSIS — S42293A Other displaced fracture of upper end of unspecified humerus, initial encounter for closed fracture: Secondary | ICD-10-CM | POA: Diagnosis not present

## 2013-10-10 DIAGNOSIS — R296 Repeated falls: Secondary | ICD-10-CM | POA: Diagnosis not present

## 2013-10-10 DIAGNOSIS — S42213A Unspecified displaced fracture of surgical neck of unspecified humerus, initial encounter for closed fracture: Secondary | ICD-10-CM | POA: Diagnosis not present

## 2013-10-10 DIAGNOSIS — S4980XA Other specified injuries of shoulder and upper arm, unspecified arm, initial encounter: Secondary | ICD-10-CM | POA: Diagnosis not present

## 2013-10-14 DIAGNOSIS — S42209A Unspecified fracture of upper end of unspecified humerus, initial encounter for closed fracture: Secondary | ICD-10-CM | POA: Diagnosis not present

## 2013-10-21 DIAGNOSIS — S42209A Unspecified fracture of upper end of unspecified humerus, initial encounter for closed fracture: Secondary | ICD-10-CM | POA: Diagnosis not present

## 2013-10-21 DIAGNOSIS — M25519 Pain in unspecified shoulder: Secondary | ICD-10-CM | POA: Diagnosis not present

## 2013-11-04 DIAGNOSIS — M25519 Pain in unspecified shoulder: Secondary | ICD-10-CM | POA: Diagnosis not present

## 2013-11-04 DIAGNOSIS — S42209A Unspecified fracture of upper end of unspecified humerus, initial encounter for closed fracture: Secondary | ICD-10-CM | POA: Diagnosis not present

## 2013-11-11 DIAGNOSIS — H26499 Other secondary cataract, unspecified eye: Secondary | ICD-10-CM | POA: Diagnosis not present

## 2013-11-11 DIAGNOSIS — H40019 Open angle with borderline findings, low risk, unspecified eye: Secondary | ICD-10-CM | POA: Diagnosis not present

## 2013-11-18 DIAGNOSIS — M25519 Pain in unspecified shoulder: Secondary | ICD-10-CM | POA: Diagnosis not present

## 2013-11-18 DIAGNOSIS — S42209A Unspecified fracture of upper end of unspecified humerus, initial encounter for closed fracture: Secondary | ICD-10-CM | POA: Diagnosis not present

## 2013-11-25 DIAGNOSIS — S42209A Unspecified fracture of upper end of unspecified humerus, initial encounter for closed fracture: Secondary | ICD-10-CM | POA: Diagnosis not present

## 2013-11-25 DIAGNOSIS — M25519 Pain in unspecified shoulder: Secondary | ICD-10-CM | POA: Diagnosis not present

## 2013-11-25 DIAGNOSIS — D2370 Other benign neoplasm of skin of unspecified lower limb, including hip: Secondary | ICD-10-CM | POA: Diagnosis not present

## 2013-11-27 DIAGNOSIS — M25519 Pain in unspecified shoulder: Secondary | ICD-10-CM | POA: Diagnosis not present

## 2013-11-27 DIAGNOSIS — S42209A Unspecified fracture of upper end of unspecified humerus, initial encounter for closed fracture: Secondary | ICD-10-CM | POA: Diagnosis not present

## 2013-12-02 DIAGNOSIS — S42209A Unspecified fracture of upper end of unspecified humerus, initial encounter for closed fracture: Secondary | ICD-10-CM | POA: Diagnosis not present

## 2013-12-02 DIAGNOSIS — M25519 Pain in unspecified shoulder: Secondary | ICD-10-CM | POA: Diagnosis not present

## 2013-12-03 DIAGNOSIS — S42209A Unspecified fracture of upper end of unspecified humerus, initial encounter for closed fracture: Secondary | ICD-10-CM | POA: Diagnosis not present

## 2013-12-03 DIAGNOSIS — M25519 Pain in unspecified shoulder: Secondary | ICD-10-CM | POA: Diagnosis not present

## 2013-12-04 DIAGNOSIS — G4752 REM sleep behavior disorder: Secondary | ICD-10-CM | POA: Diagnosis not present

## 2013-12-05 DIAGNOSIS — M25519 Pain in unspecified shoulder: Secondary | ICD-10-CM | POA: Diagnosis not present

## 2013-12-05 DIAGNOSIS — S42209A Unspecified fracture of upper end of unspecified humerus, initial encounter for closed fracture: Secondary | ICD-10-CM | POA: Diagnosis not present

## 2013-12-08 DIAGNOSIS — G4733 Obstructive sleep apnea (adult) (pediatric): Secondary | ICD-10-CM | POA: Diagnosis not present

## 2013-12-08 DIAGNOSIS — G4752 REM sleep behavior disorder: Secondary | ICD-10-CM | POA: Diagnosis not present

## 2013-12-08 DIAGNOSIS — G2581 Restless legs syndrome: Secondary | ICD-10-CM | POA: Diagnosis not present

## 2013-12-09 DIAGNOSIS — S42209A Unspecified fracture of upper end of unspecified humerus, initial encounter for closed fracture: Secondary | ICD-10-CM | POA: Diagnosis not present

## 2013-12-09 DIAGNOSIS — M25519 Pain in unspecified shoulder: Secondary | ICD-10-CM | POA: Diagnosis not present

## 2013-12-10 DIAGNOSIS — S42209A Unspecified fracture of upper end of unspecified humerus, initial encounter for closed fracture: Secondary | ICD-10-CM | POA: Diagnosis not present

## 2013-12-10 DIAGNOSIS — M25519 Pain in unspecified shoulder: Secondary | ICD-10-CM | POA: Diagnosis not present

## 2013-12-12 DIAGNOSIS — M25519 Pain in unspecified shoulder: Secondary | ICD-10-CM | POA: Diagnosis not present

## 2013-12-12 DIAGNOSIS — S42209A Unspecified fracture of upper end of unspecified humerus, initial encounter for closed fracture: Secondary | ICD-10-CM | POA: Diagnosis not present

## 2013-12-16 DIAGNOSIS — S42209A Unspecified fracture of upper end of unspecified humerus, initial encounter for closed fracture: Secondary | ICD-10-CM | POA: Diagnosis not present

## 2013-12-16 DIAGNOSIS — M25519 Pain in unspecified shoulder: Secondary | ICD-10-CM | POA: Diagnosis not present

## 2013-12-17 DIAGNOSIS — S42209A Unspecified fracture of upper end of unspecified humerus, initial encounter for closed fracture: Secondary | ICD-10-CM | POA: Diagnosis not present

## 2013-12-17 DIAGNOSIS — M25519 Pain in unspecified shoulder: Secondary | ICD-10-CM | POA: Diagnosis not present

## 2013-12-19 DIAGNOSIS — S42209A Unspecified fracture of upper end of unspecified humerus, initial encounter for closed fracture: Secondary | ICD-10-CM | POA: Diagnosis not present

## 2013-12-19 DIAGNOSIS — M25519 Pain in unspecified shoulder: Secondary | ICD-10-CM | POA: Diagnosis not present

## 2013-12-22 DIAGNOSIS — M25519 Pain in unspecified shoulder: Secondary | ICD-10-CM | POA: Diagnosis not present

## 2013-12-22 DIAGNOSIS — S42209A Unspecified fracture of upper end of unspecified humerus, initial encounter for closed fracture: Secondary | ICD-10-CM | POA: Diagnosis not present

## 2013-12-22 DIAGNOSIS — H26499 Other secondary cataract, unspecified eye: Secondary | ICD-10-CM | POA: Diagnosis not present

## 2014-01-06 DIAGNOSIS — Z1212 Encounter for screening for malignant neoplasm of rectum: Secondary | ICD-10-CM | POA: Diagnosis not present

## 2014-01-06 DIAGNOSIS — Z780 Asymptomatic menopausal state: Secondary | ICD-10-CM | POA: Diagnosis not present

## 2014-01-06 DIAGNOSIS — Z01419 Encounter for gynecological examination (general) (routine) without abnormal findings: Secondary | ICD-10-CM | POA: Diagnosis not present

## 2014-01-06 DIAGNOSIS — E669 Obesity, unspecified: Secondary | ICD-10-CM | POA: Diagnosis not present

## 2014-01-06 DIAGNOSIS — N952 Postmenopausal atrophic vaginitis: Secondary | ICD-10-CM | POA: Diagnosis not present

## 2014-01-06 DIAGNOSIS — N8111 Cystocele, midline: Secondary | ICD-10-CM | POA: Diagnosis not present

## 2014-01-06 DIAGNOSIS — Z1231 Encounter for screening mammogram for malignant neoplasm of breast: Secondary | ICD-10-CM | POA: Diagnosis not present

## 2014-01-19 DIAGNOSIS — M25519 Pain in unspecified shoulder: Secondary | ICD-10-CM | POA: Diagnosis not present

## 2014-01-19 DIAGNOSIS — S42209A Unspecified fracture of upper end of unspecified humerus, initial encounter for closed fracture: Secondary | ICD-10-CM | POA: Diagnosis not present

## 2014-02-02 DIAGNOSIS — E782 Mixed hyperlipidemia: Secondary | ICD-10-CM | POA: Diagnosis not present

## 2014-02-02 DIAGNOSIS — E669 Obesity, unspecified: Secondary | ICD-10-CM | POA: Diagnosis not present

## 2014-02-02 DIAGNOSIS — E039 Hypothyroidism, unspecified: Secondary | ICD-10-CM | POA: Diagnosis not present

## 2014-02-02 DIAGNOSIS — I10 Essential (primary) hypertension: Secondary | ICD-10-CM | POA: Diagnosis not present

## 2014-02-02 DIAGNOSIS — K219 Gastro-esophageal reflux disease without esophagitis: Secondary | ICD-10-CM | POA: Diagnosis not present

## 2014-02-09 DIAGNOSIS — E669 Obesity, unspecified: Secondary | ICD-10-CM | POA: Diagnosis not present

## 2014-02-09 DIAGNOSIS — J45902 Unspecified asthma with status asthmaticus: Secondary | ICD-10-CM | POA: Diagnosis not present

## 2014-02-09 DIAGNOSIS — I10 Essential (primary) hypertension: Secondary | ICD-10-CM | POA: Diagnosis not present

## 2014-02-09 DIAGNOSIS — R609 Edema, unspecified: Secondary | ICD-10-CM | POA: Diagnosis not present

## 2014-02-09 DIAGNOSIS — K219 Gastro-esophageal reflux disease without esophagitis: Secondary | ICD-10-CM | POA: Diagnosis not present

## 2014-02-09 DIAGNOSIS — M129 Arthropathy, unspecified: Secondary | ICD-10-CM | POA: Diagnosis not present

## 2014-02-09 DIAGNOSIS — G2581 Restless legs syndrome: Secondary | ICD-10-CM | POA: Diagnosis not present

## 2014-02-09 DIAGNOSIS — E039 Hypothyroidism, unspecified: Secondary | ICD-10-CM | POA: Diagnosis not present

## 2014-05-06 DIAGNOSIS — D235 Other benign neoplasm of skin of trunk: Secondary | ICD-10-CM | POA: Diagnosis not present

## 2014-05-06 DIAGNOSIS — D485 Neoplasm of uncertain behavior of skin: Secondary | ICD-10-CM | POA: Diagnosis not present

## 2014-06-04 DIAGNOSIS — I10 Essential (primary) hypertension: Secondary | ICD-10-CM | POA: Diagnosis not present

## 2014-06-04 DIAGNOSIS — G4752 REM sleep behavior disorder: Secondary | ICD-10-CM | POA: Diagnosis not present

## 2014-06-04 DIAGNOSIS — G4733 Obstructive sleep apnea (adult) (pediatric): Secondary | ICD-10-CM | POA: Diagnosis not present

## 2014-06-15 DIAGNOSIS — J309 Allergic rhinitis, unspecified: Secondary | ICD-10-CM | POA: Diagnosis not present

## 2014-06-15 DIAGNOSIS — G4752 REM sleep behavior disorder: Secondary | ICD-10-CM | POA: Diagnosis not present

## 2014-06-15 DIAGNOSIS — G2581 Restless legs syndrome: Secondary | ICD-10-CM | POA: Diagnosis not present

## 2014-06-15 DIAGNOSIS — G4733 Obstructive sleep apnea (adult) (pediatric): Secondary | ICD-10-CM | POA: Diagnosis not present

## 2014-07-07 DIAGNOSIS — E039 Hypothyroidism, unspecified: Secondary | ICD-10-CM | POA: Diagnosis not present

## 2014-07-07 DIAGNOSIS — M129 Arthropathy, unspecified: Secondary | ICD-10-CM | POA: Diagnosis not present

## 2014-07-07 DIAGNOSIS — I10 Essential (primary) hypertension: Secondary | ICD-10-CM | POA: Diagnosis not present

## 2014-07-07 DIAGNOSIS — E669 Obesity, unspecified: Secondary | ICD-10-CM | POA: Diagnosis not present

## 2014-07-07 DIAGNOSIS — R7309 Other abnormal glucose: Secondary | ICD-10-CM | POA: Diagnosis not present

## 2014-07-07 DIAGNOSIS — E782 Mixed hyperlipidemia: Secondary | ICD-10-CM | POA: Diagnosis not present

## 2014-07-07 DIAGNOSIS — K219 Gastro-esophageal reflux disease without esophagitis: Secondary | ICD-10-CM | POA: Diagnosis not present

## 2014-07-08 DIAGNOSIS — H26499 Other secondary cataract, unspecified eye: Secondary | ICD-10-CM | POA: Diagnosis not present

## 2014-07-14 DIAGNOSIS — E782 Mixed hyperlipidemia: Secondary | ICD-10-CM | POA: Diagnosis not present

## 2014-07-14 DIAGNOSIS — M129 Arthropathy, unspecified: Secondary | ICD-10-CM | POA: Diagnosis not present

## 2014-07-14 DIAGNOSIS — R609 Edema, unspecified: Secondary | ICD-10-CM | POA: Diagnosis not present

## 2014-07-14 DIAGNOSIS — G2581 Restless legs syndrome: Secondary | ICD-10-CM | POA: Diagnosis not present

## 2014-07-14 DIAGNOSIS — E669 Obesity, unspecified: Secondary | ICD-10-CM | POA: Diagnosis not present

## 2014-07-14 DIAGNOSIS — K219 Gastro-esophageal reflux disease without esophagitis: Secondary | ICD-10-CM | POA: Diagnosis not present

## 2014-07-14 DIAGNOSIS — I10 Essential (primary) hypertension: Secondary | ICD-10-CM | POA: Diagnosis not present

## 2014-07-14 DIAGNOSIS — E039 Hypothyroidism, unspecified: Secondary | ICD-10-CM | POA: Diagnosis not present

## 2014-07-19 DIAGNOSIS — R748 Abnormal levels of other serum enzymes: Secondary | ICD-10-CM | POA: Diagnosis not present

## 2014-07-19 DIAGNOSIS — S0990XA Unspecified injury of head, initial encounter: Secondary | ICD-10-CM | POA: Diagnosis not present

## 2014-07-19 DIAGNOSIS — S298XXA Other specified injuries of thorax, initial encounter: Secondary | ICD-10-CM | POA: Diagnosis not present

## 2014-07-19 DIAGNOSIS — R509 Fever, unspecified: Secondary | ICD-10-CM | POA: Diagnosis not present

## 2014-07-19 DIAGNOSIS — R42 Dizziness and giddiness: Secondary | ICD-10-CM | POA: Diagnosis not present

## 2014-07-19 DIAGNOSIS — L270 Generalized skin eruption due to drugs and medicaments taken internally: Secondary | ICD-10-CM | POA: Diagnosis not present

## 2014-07-19 DIAGNOSIS — T888XXA Other specified complications of surgical and medical care, not elsewhere classified, initial encounter: Secondary | ICD-10-CM | POA: Diagnosis not present

## 2014-07-19 DIAGNOSIS — R0902 Hypoxemia: Secondary | ICD-10-CM | POA: Diagnosis not present

## 2014-07-20 DIAGNOSIS — Z79899 Other long term (current) drug therapy: Secondary | ICD-10-CM | POA: Diagnosis not present

## 2014-07-20 DIAGNOSIS — L270 Generalized skin eruption due to drugs and medicaments taken internally: Secondary | ICD-10-CM | POA: Diagnosis not present

## 2014-09-05 DIAGNOSIS — Z23 Encounter for immunization: Secondary | ICD-10-CM | POA: Diagnosis not present

## 2014-09-14 DIAGNOSIS — D485 Neoplasm of uncertain behavior of skin: Secondary | ICD-10-CM | POA: Diagnosis not present

## 2014-10-16 DIAGNOSIS — S332XXA Dislocation of sacroiliac and sacrococcygeal joint, initial encounter: Secondary | ICD-10-CM | POA: Diagnosis not present

## 2014-10-16 DIAGNOSIS — M9904 Segmental and somatic dysfunction of sacral region: Secondary | ICD-10-CM | POA: Diagnosis not present

## 2014-10-22 DIAGNOSIS — M9904 Segmental and somatic dysfunction of sacral region: Secondary | ICD-10-CM | POA: Diagnosis not present

## 2014-10-22 DIAGNOSIS — S332XXA Dislocation of sacroiliac and sacrococcygeal joint, initial encounter: Secondary | ICD-10-CM | POA: Diagnosis not present

## 2014-10-29 DIAGNOSIS — S332XXA Dislocation of sacroiliac and sacrococcygeal joint, initial encounter: Secondary | ICD-10-CM | POA: Diagnosis not present

## 2014-10-29 DIAGNOSIS — M9904 Segmental and somatic dysfunction of sacral region: Secondary | ICD-10-CM | POA: Diagnosis not present

## 2014-11-05 DIAGNOSIS — M9904 Segmental and somatic dysfunction of sacral region: Secondary | ICD-10-CM | POA: Diagnosis not present

## 2014-11-05 DIAGNOSIS — S332XXA Dislocation of sacroiliac and sacrococcygeal joint, initial encounter: Secondary | ICD-10-CM | POA: Diagnosis not present

## 2014-11-11 DIAGNOSIS — S332XXA Dislocation of sacroiliac and sacrococcygeal joint, initial encounter: Secondary | ICD-10-CM | POA: Diagnosis not present

## 2014-11-11 DIAGNOSIS — M9904 Segmental and somatic dysfunction of sacral region: Secondary | ICD-10-CM | POA: Diagnosis not present

## 2014-12-01 DIAGNOSIS — K219 Gastro-esophageal reflux disease without esophagitis: Secondary | ICD-10-CM | POA: Diagnosis not present

## 2014-12-01 DIAGNOSIS — E782 Mixed hyperlipidemia: Secondary | ICD-10-CM | POA: Diagnosis not present

## 2014-12-01 DIAGNOSIS — R739 Hyperglycemia, unspecified: Secondary | ICD-10-CM | POA: Diagnosis not present

## 2014-12-01 DIAGNOSIS — I10 Essential (primary) hypertension: Secondary | ICD-10-CM | POA: Diagnosis not present

## 2014-12-01 DIAGNOSIS — E669 Obesity, unspecified: Secondary | ICD-10-CM | POA: Diagnosis not present

## 2014-12-15 DIAGNOSIS — K219 Gastro-esophageal reflux disease without esophagitis: Secondary | ICD-10-CM | POA: Diagnosis not present

## 2014-12-15 DIAGNOSIS — R609 Edema, unspecified: Secondary | ICD-10-CM | POA: Diagnosis not present

## 2014-12-15 DIAGNOSIS — E039 Hypothyroidism, unspecified: Secondary | ICD-10-CM | POA: Diagnosis not present

## 2014-12-15 DIAGNOSIS — G2581 Restless legs syndrome: Secondary | ICD-10-CM | POA: Diagnosis not present

## 2014-12-15 DIAGNOSIS — M1990 Unspecified osteoarthritis, unspecified site: Secondary | ICD-10-CM | POA: Diagnosis not present

## 2014-12-15 DIAGNOSIS — Z1389 Encounter for screening for other disorder: Secondary | ICD-10-CM | POA: Diagnosis not present

## 2014-12-15 DIAGNOSIS — I10 Essential (primary) hypertension: Secondary | ICD-10-CM | POA: Diagnosis not present

## 2014-12-15 DIAGNOSIS — E669 Obesity, unspecified: Secondary | ICD-10-CM | POA: Diagnosis not present

## 2015-01-11 DIAGNOSIS — H26492 Other secondary cataract, left eye: Secondary | ICD-10-CM | POA: Diagnosis not present

## 2015-01-20 DIAGNOSIS — R635 Abnormal weight gain: Secondary | ICD-10-CM | POA: Diagnosis not present

## 2015-01-20 DIAGNOSIS — E669 Obesity, unspecified: Secondary | ICD-10-CM | POA: Diagnosis not present

## 2015-01-20 DIAGNOSIS — N952 Postmenopausal atrophic vaginitis: Secondary | ICD-10-CM | POA: Diagnosis not present

## 2015-01-20 DIAGNOSIS — Z01419 Encounter for gynecological examination (general) (routine) without abnormal findings: Secondary | ICD-10-CM | POA: Diagnosis not present

## 2015-01-20 DIAGNOSIS — Z780 Asymptomatic menopausal state: Secondary | ICD-10-CM | POA: Diagnosis not present

## 2015-01-20 DIAGNOSIS — Z1231 Encounter for screening mammogram for malignant neoplasm of breast: Secondary | ICD-10-CM | POA: Diagnosis not present

## 2015-01-20 DIAGNOSIS — Z1212 Encounter for screening for malignant neoplasm of rectum: Secondary | ICD-10-CM | POA: Diagnosis not present

## 2015-01-20 DIAGNOSIS — N8110 Cystocele, unspecified: Secondary | ICD-10-CM | POA: Diagnosis not present

## 2015-02-18 DIAGNOSIS — G4733 Obstructive sleep apnea (adult) (pediatric): Secondary | ICD-10-CM | POA: Diagnosis not present

## 2015-02-18 DIAGNOSIS — J3089 Other allergic rhinitis: Secondary | ICD-10-CM | POA: Diagnosis not present

## 2015-02-18 DIAGNOSIS — G4752 REM sleep behavior disorder: Secondary | ICD-10-CM | POA: Diagnosis not present

## 2015-02-18 DIAGNOSIS — G2581 Restless legs syndrome: Secondary | ICD-10-CM | POA: Diagnosis not present

## 2015-06-03 DIAGNOSIS — E039 Hypothyroidism, unspecified: Secondary | ICD-10-CM | POA: Diagnosis not present

## 2015-06-03 DIAGNOSIS — R739 Hyperglycemia, unspecified: Secondary | ICD-10-CM | POA: Diagnosis not present

## 2015-06-03 DIAGNOSIS — K219 Gastro-esophageal reflux disease without esophagitis: Secondary | ICD-10-CM | POA: Diagnosis not present

## 2015-06-03 DIAGNOSIS — E782 Mixed hyperlipidemia: Secondary | ICD-10-CM | POA: Diagnosis not present

## 2015-06-03 DIAGNOSIS — I10 Essential (primary) hypertension: Secondary | ICD-10-CM | POA: Diagnosis not present

## 2015-06-22 DIAGNOSIS — I10 Essential (primary) hypertension: Secondary | ICD-10-CM | POA: Diagnosis not present

## 2015-06-22 DIAGNOSIS — R739 Hyperglycemia, unspecified: Secondary | ICD-10-CM | POA: Diagnosis not present

## 2015-06-22 DIAGNOSIS — E782 Mixed hyperlipidemia: Secondary | ICD-10-CM | POA: Diagnosis not present

## 2015-06-22 DIAGNOSIS — R609 Edema, unspecified: Secondary | ICD-10-CM | POA: Diagnosis not present

## 2015-06-22 DIAGNOSIS — E039 Hypothyroidism, unspecified: Secondary | ICD-10-CM | POA: Diagnosis not present

## 2015-06-22 DIAGNOSIS — G2581 Restless legs syndrome: Secondary | ICD-10-CM | POA: Diagnosis not present

## 2015-06-22 DIAGNOSIS — M1990 Unspecified osteoarthritis, unspecified site: Secondary | ICD-10-CM | POA: Diagnosis not present

## 2015-06-22 DIAGNOSIS — K219 Gastro-esophageal reflux disease without esophagitis: Secondary | ICD-10-CM | POA: Diagnosis not present

## 2015-06-22 DIAGNOSIS — E669 Obesity, unspecified: Secondary | ICD-10-CM | POA: Diagnosis not present

## 2015-08-30 DIAGNOSIS — G2581 Restless legs syndrome: Secondary | ICD-10-CM | POA: Diagnosis not present

## 2015-08-30 DIAGNOSIS — G4752 REM sleep behavior disorder: Secondary | ICD-10-CM | POA: Diagnosis not present

## 2015-08-30 DIAGNOSIS — G4733 Obstructive sleep apnea (adult) (pediatric): Secondary | ICD-10-CM | POA: Diagnosis not present

## 2015-08-30 DIAGNOSIS — J3089 Other allergic rhinitis: Secondary | ICD-10-CM | POA: Diagnosis not present

## 2015-08-30 DIAGNOSIS — Z23 Encounter for immunization: Secondary | ICD-10-CM | POA: Diagnosis not present

## 2015-09-16 DIAGNOSIS — Z1283 Encounter for screening for malignant neoplasm of skin: Secondary | ICD-10-CM | POA: Diagnosis not present

## 2015-09-16 DIAGNOSIS — D225 Melanocytic nevi of trunk: Secondary | ICD-10-CM | POA: Diagnosis not present

## 2015-09-16 DIAGNOSIS — D485 Neoplasm of uncertain behavior of skin: Secondary | ICD-10-CM | POA: Diagnosis not present

## 2015-11-16 DIAGNOSIS — G4733 Obstructive sleep apnea (adult) (pediatric): Secondary | ICD-10-CM | POA: Diagnosis not present

## 2015-11-16 DIAGNOSIS — G2581 Restless legs syndrome: Secondary | ICD-10-CM | POA: Diagnosis not present

## 2015-11-16 DIAGNOSIS — G4752 REM sleep behavior disorder: Secondary | ICD-10-CM | POA: Diagnosis not present

## 2015-11-22 DIAGNOSIS — H40013 Open angle with borderline findings, low risk, bilateral: Secondary | ICD-10-CM | POA: Diagnosis not present

## 2015-12-14 DIAGNOSIS — R739 Hyperglycemia, unspecified: Secondary | ICD-10-CM | POA: Diagnosis not present

## 2015-12-14 DIAGNOSIS — E782 Mixed hyperlipidemia: Secondary | ICD-10-CM | POA: Diagnosis not present

## 2015-12-14 DIAGNOSIS — E039 Hypothyroidism, unspecified: Secondary | ICD-10-CM | POA: Diagnosis not present

## 2015-12-14 DIAGNOSIS — M1990 Unspecified osteoarthritis, unspecified site: Secondary | ICD-10-CM | POA: Diagnosis not present

## 2015-12-14 DIAGNOSIS — I10 Essential (primary) hypertension: Secondary | ICD-10-CM | POA: Diagnosis not present

## 2015-12-14 DIAGNOSIS — K219 Gastro-esophageal reflux disease without esophagitis: Secondary | ICD-10-CM | POA: Diagnosis not present

## 2015-12-21 DIAGNOSIS — E782 Mixed hyperlipidemia: Secondary | ICD-10-CM | POA: Diagnosis not present

## 2015-12-21 DIAGNOSIS — K219 Gastro-esophageal reflux disease without esophagitis: Secondary | ICD-10-CM | POA: Diagnosis not present

## 2015-12-21 DIAGNOSIS — R739 Hyperglycemia, unspecified: Secondary | ICD-10-CM | POA: Diagnosis not present

## 2015-12-21 DIAGNOSIS — E669 Obesity, unspecified: Secondary | ICD-10-CM | POA: Diagnosis not present

## 2015-12-21 DIAGNOSIS — G2581 Restless legs syndrome: Secondary | ICD-10-CM | POA: Diagnosis not present

## 2015-12-21 DIAGNOSIS — E039 Hypothyroidism, unspecified: Secondary | ICD-10-CM | POA: Diagnosis not present

## 2015-12-21 DIAGNOSIS — M1990 Unspecified osteoarthritis, unspecified site: Secondary | ICD-10-CM | POA: Diagnosis not present

## 2015-12-21 DIAGNOSIS — I10 Essential (primary) hypertension: Secondary | ICD-10-CM | POA: Diagnosis not present

## 2015-12-21 DIAGNOSIS — Z1389 Encounter for screening for other disorder: Secondary | ICD-10-CM | POA: Diagnosis not present

## 2015-12-21 DIAGNOSIS — R609 Edema, unspecified: Secondary | ICD-10-CM | POA: Diagnosis not present

## 2015-12-23 DIAGNOSIS — D485 Neoplasm of uncertain behavior of skin: Secondary | ICD-10-CM | POA: Diagnosis not present

## 2016-01-25 DIAGNOSIS — M8580 Other specified disorders of bone density and structure, unspecified site: Secondary | ICD-10-CM | POA: Diagnosis not present

## 2016-01-25 DIAGNOSIS — N952 Postmenopausal atrophic vaginitis: Secondary | ICD-10-CM | POA: Diagnosis not present

## 2016-01-25 DIAGNOSIS — Z1231 Encounter for screening mammogram for malignant neoplasm of breast: Secondary | ICD-10-CM | POA: Diagnosis not present

## 2016-01-25 DIAGNOSIS — Z1212 Encounter for screening for malignant neoplasm of rectum: Secondary | ICD-10-CM | POA: Diagnosis not present

## 2016-01-25 DIAGNOSIS — Z780 Asymptomatic menopausal state: Secondary | ICD-10-CM | POA: Diagnosis not present

## 2016-01-25 DIAGNOSIS — N8110 Cystocele, unspecified: Secondary | ICD-10-CM | POA: Diagnosis not present

## 2016-02-28 DIAGNOSIS — G4733 Obstructive sleep apnea (adult) (pediatric): Secondary | ICD-10-CM | POA: Diagnosis not present

## 2016-02-28 DIAGNOSIS — G2581 Restless legs syndrome: Secondary | ICD-10-CM | POA: Diagnosis not present

## 2016-02-28 DIAGNOSIS — G4752 REM sleep behavior disorder: Secondary | ICD-10-CM | POA: Diagnosis not present

## 2016-05-04 DIAGNOSIS — Z780 Asymptomatic menopausal state: Secondary | ICD-10-CM | POA: Diagnosis not present

## 2016-06-07 DIAGNOSIS — M1990 Unspecified osteoarthritis, unspecified site: Secondary | ICD-10-CM | POA: Diagnosis not present

## 2016-06-07 DIAGNOSIS — E782 Mixed hyperlipidemia: Secondary | ICD-10-CM | POA: Diagnosis not present

## 2016-06-07 DIAGNOSIS — I10 Essential (primary) hypertension: Secondary | ICD-10-CM | POA: Diagnosis not present

## 2016-06-07 DIAGNOSIS — E039 Hypothyroidism, unspecified: Secondary | ICD-10-CM | POA: Diagnosis not present

## 2016-06-07 DIAGNOSIS — K219 Gastro-esophageal reflux disease without esophagitis: Secondary | ICD-10-CM | POA: Diagnosis not present

## 2016-06-07 DIAGNOSIS — R739 Hyperglycemia, unspecified: Secondary | ICD-10-CM | POA: Diagnosis not present

## 2016-06-15 DIAGNOSIS — M1990 Unspecified osteoarthritis, unspecified site: Secondary | ICD-10-CM | POA: Diagnosis not present

## 2016-06-15 DIAGNOSIS — E669 Obesity, unspecified: Secondary | ICD-10-CM | POA: Diagnosis not present

## 2016-06-15 DIAGNOSIS — E039 Hypothyroidism, unspecified: Secondary | ICD-10-CM | POA: Diagnosis not present

## 2016-06-15 DIAGNOSIS — R609 Edema, unspecified: Secondary | ICD-10-CM | POA: Diagnosis not present

## 2016-06-15 DIAGNOSIS — K219 Gastro-esophageal reflux disease without esophagitis: Secondary | ICD-10-CM | POA: Diagnosis not present

## 2016-06-15 DIAGNOSIS — E782 Mixed hyperlipidemia: Secondary | ICD-10-CM | POA: Diagnosis not present

## 2016-06-15 DIAGNOSIS — I10 Essential (primary) hypertension: Secondary | ICD-10-CM | POA: Diagnosis not present

## 2016-06-15 DIAGNOSIS — R739 Hyperglycemia, unspecified: Secondary | ICD-10-CM | POA: Diagnosis not present

## 2016-07-17 DIAGNOSIS — G4752 REM sleep behavior disorder: Secondary | ICD-10-CM | POA: Diagnosis not present

## 2016-09-14 DIAGNOSIS — Z23 Encounter for immunization: Secondary | ICD-10-CM | POA: Diagnosis not present

## 2016-09-14 DIAGNOSIS — G2581 Restless legs syndrome: Secondary | ICD-10-CM | POA: Diagnosis not present

## 2016-09-14 DIAGNOSIS — J3089 Other allergic rhinitis: Secondary | ICD-10-CM | POA: Diagnosis not present

## 2016-09-14 DIAGNOSIS — G4733 Obstructive sleep apnea (adult) (pediatric): Secondary | ICD-10-CM | POA: Diagnosis not present

## 2016-09-14 DIAGNOSIS — G4752 REM sleep behavior disorder: Secondary | ICD-10-CM | POA: Diagnosis not present

## 2016-09-15 DIAGNOSIS — Z23 Encounter for immunization: Secondary | ICD-10-CM | POA: Diagnosis not present

## 2016-09-26 DIAGNOSIS — W1830XA Fall on same level, unspecified, initial encounter: Secondary | ICD-10-CM | POA: Diagnosis not present

## 2016-09-26 DIAGNOSIS — Z96652 Presence of left artificial knee joint: Secondary | ICD-10-CM | POA: Diagnosis not present

## 2016-09-26 DIAGNOSIS — Y9239 Other specified sports and athletic area as the place of occurrence of the external cause: Secondary | ICD-10-CM | POA: Diagnosis not present

## 2016-09-26 DIAGNOSIS — Y998 Other external cause status: Secondary | ICD-10-CM | POA: Diagnosis not present

## 2016-09-26 DIAGNOSIS — S299XXA Unspecified injury of thorax, initial encounter: Secondary | ICD-10-CM | POA: Diagnosis not present

## 2016-09-26 DIAGNOSIS — Y9389 Activity, other specified: Secondary | ICD-10-CM | POA: Diagnosis not present

## 2016-09-26 DIAGNOSIS — Z882 Allergy status to sulfonamides status: Secondary | ICD-10-CM | POA: Diagnosis not present

## 2016-09-26 DIAGNOSIS — W010XXA Fall on same level from slipping, tripping and stumbling without subsequent striking against object, initial encounter: Secondary | ICD-10-CM | POA: Diagnosis not present

## 2016-10-23 DIAGNOSIS — R05 Cough: Secondary | ICD-10-CM | POA: Diagnosis not present

## 2016-10-23 DIAGNOSIS — Z6841 Body Mass Index (BMI) 40.0 and over, adult: Secondary | ICD-10-CM | POA: Diagnosis not present

## 2016-10-23 DIAGNOSIS — J019 Acute sinusitis, unspecified: Secondary | ICD-10-CM | POA: Diagnosis not present

## 2016-11-21 DIAGNOSIS — J019 Acute sinusitis, unspecified: Secondary | ICD-10-CM | POA: Diagnosis not present

## 2016-11-21 DIAGNOSIS — Z6841 Body Mass Index (BMI) 40.0 and over, adult: Secondary | ICD-10-CM | POA: Diagnosis not present

## 2016-11-21 DIAGNOSIS — J209 Acute bronchitis, unspecified: Secondary | ICD-10-CM | POA: Diagnosis not present

## 2016-11-27 DIAGNOSIS — H40023 Open angle with borderline findings, high risk, bilateral: Secondary | ICD-10-CM | POA: Diagnosis not present

## 2016-12-08 DIAGNOSIS — R739 Hyperglycemia, unspecified: Secondary | ICD-10-CM | POA: Diagnosis not present

## 2016-12-08 DIAGNOSIS — K219 Gastro-esophageal reflux disease without esophagitis: Secondary | ICD-10-CM | POA: Diagnosis not present

## 2016-12-08 DIAGNOSIS — G2581 Restless legs syndrome: Secondary | ICD-10-CM | POA: Diagnosis not present

## 2016-12-08 DIAGNOSIS — M1990 Unspecified osteoarthritis, unspecified site: Secondary | ICD-10-CM | POA: Diagnosis not present

## 2016-12-08 DIAGNOSIS — E039 Hypothyroidism, unspecified: Secondary | ICD-10-CM | POA: Diagnosis not present

## 2016-12-08 DIAGNOSIS — I10 Essential (primary) hypertension: Secondary | ICD-10-CM | POA: Diagnosis not present

## 2016-12-08 DIAGNOSIS — E782 Mixed hyperlipidemia: Secondary | ICD-10-CM | POA: Diagnosis not present

## 2016-12-13 DIAGNOSIS — E039 Hypothyroidism, unspecified: Secondary | ICD-10-CM | POA: Diagnosis not present

## 2016-12-13 DIAGNOSIS — E669 Obesity, unspecified: Secondary | ICD-10-CM | POA: Diagnosis not present

## 2016-12-13 DIAGNOSIS — M1990 Unspecified osteoarthritis, unspecified site: Secondary | ICD-10-CM | POA: Diagnosis not present

## 2016-12-13 DIAGNOSIS — I10 Essential (primary) hypertension: Secondary | ICD-10-CM | POA: Diagnosis not present

## 2016-12-13 DIAGNOSIS — K219 Gastro-esophageal reflux disease without esophagitis: Secondary | ICD-10-CM | POA: Diagnosis not present

## 2016-12-13 DIAGNOSIS — R609 Edema, unspecified: Secondary | ICD-10-CM | POA: Diagnosis not present

## 2016-12-13 DIAGNOSIS — R739 Hyperglycemia, unspecified: Secondary | ICD-10-CM | POA: Diagnosis not present

## 2016-12-13 DIAGNOSIS — E782 Mixed hyperlipidemia: Secondary | ICD-10-CM | POA: Diagnosis not present

## 2016-12-28 DIAGNOSIS — D225 Melanocytic nevi of trunk: Secondary | ICD-10-CM | POA: Diagnosis not present

## 2016-12-28 DIAGNOSIS — Z1283 Encounter for screening for malignant neoplasm of skin: Secondary | ICD-10-CM | POA: Diagnosis not present

## 2017-01-30 DIAGNOSIS — E039 Hypothyroidism, unspecified: Secondary | ICD-10-CM | POA: Diagnosis not present

## 2017-02-20 DIAGNOSIS — Z780 Asymptomatic menopausal state: Secondary | ICD-10-CM | POA: Diagnosis not present

## 2017-02-20 DIAGNOSIS — N8111 Cystocele, midline: Secondary | ICD-10-CM | POA: Diagnosis not present

## 2017-02-20 DIAGNOSIS — Z1212 Encounter for screening for malignant neoplasm of rectum: Secondary | ICD-10-CM | POA: Diagnosis not present

## 2017-02-20 DIAGNOSIS — N952 Postmenopausal atrophic vaginitis: Secondary | ICD-10-CM | POA: Diagnosis not present

## 2017-02-20 DIAGNOSIS — Z1231 Encounter for screening mammogram for malignant neoplasm of breast: Secondary | ICD-10-CM | POA: Diagnosis not present

## 2017-04-12 DIAGNOSIS — G4733 Obstructive sleep apnea (adult) (pediatric): Secondary | ICD-10-CM | POA: Diagnosis not present

## 2017-04-12 DIAGNOSIS — G2581 Restless legs syndrome: Secondary | ICD-10-CM | POA: Diagnosis not present

## 2017-04-12 DIAGNOSIS — G4752 REM sleep behavior disorder: Secondary | ICD-10-CM | POA: Diagnosis not present

## 2017-04-12 DIAGNOSIS — J3089 Other allergic rhinitis: Secondary | ICD-10-CM | POA: Diagnosis not present

## 2017-06-11 DIAGNOSIS — K219 Gastro-esophageal reflux disease without esophagitis: Secondary | ICD-10-CM | POA: Diagnosis not present

## 2017-06-11 DIAGNOSIS — G2581 Restless legs syndrome: Secondary | ICD-10-CM | POA: Diagnosis not present

## 2017-06-11 DIAGNOSIS — E782 Mixed hyperlipidemia: Secondary | ICD-10-CM | POA: Diagnosis not present

## 2017-06-11 DIAGNOSIS — E039 Hypothyroidism, unspecified: Secondary | ICD-10-CM | POA: Diagnosis not present

## 2017-06-11 DIAGNOSIS — I10 Essential (primary) hypertension: Secondary | ICD-10-CM | POA: Diagnosis not present

## 2017-06-11 DIAGNOSIS — R609 Edema, unspecified: Secondary | ICD-10-CM | POA: Diagnosis not present

## 2017-06-11 DIAGNOSIS — M1990 Unspecified osteoarthritis, unspecified site: Secondary | ICD-10-CM | POA: Diagnosis not present

## 2017-06-11 DIAGNOSIS — R739 Hyperglycemia, unspecified: Secondary | ICD-10-CM | POA: Diagnosis not present

## 2017-06-15 DIAGNOSIS — G2581 Restless legs syndrome: Secondary | ICD-10-CM | POA: Diagnosis not present

## 2017-06-15 DIAGNOSIS — E782 Mixed hyperlipidemia: Secondary | ICD-10-CM | POA: Diagnosis not present

## 2017-06-15 DIAGNOSIS — R739 Hyperglycemia, unspecified: Secondary | ICD-10-CM | POA: Diagnosis not present

## 2017-06-15 DIAGNOSIS — Z6841 Body Mass Index (BMI) 40.0 and over, adult: Secondary | ICD-10-CM | POA: Diagnosis not present

## 2017-06-15 DIAGNOSIS — I10 Essential (primary) hypertension: Secondary | ICD-10-CM | POA: Diagnosis not present

## 2017-06-15 DIAGNOSIS — K219 Gastro-esophageal reflux disease without esophagitis: Secondary | ICD-10-CM | POA: Diagnosis not present

## 2017-06-15 DIAGNOSIS — R609 Edema, unspecified: Secondary | ICD-10-CM | POA: Diagnosis not present

## 2017-06-15 DIAGNOSIS — E039 Hypothyroidism, unspecified: Secondary | ICD-10-CM | POA: Diagnosis not present

## 2017-07-06 DIAGNOSIS — R0982 Postnasal drip: Secondary | ICD-10-CM | POA: Diagnosis not present

## 2017-07-06 DIAGNOSIS — J040 Acute laryngitis: Secondary | ICD-10-CM | POA: Diagnosis not present

## 2017-08-06 DIAGNOSIS — E039 Hypothyroidism, unspecified: Secondary | ICD-10-CM | POA: Diagnosis not present

## 2017-09-10 ENCOUNTER — Ambulatory Visit (INDEPENDENT_AMBULATORY_CARE_PROVIDER_SITE_OTHER): Payer: Medicare Other | Admitting: Orthopedic Surgery

## 2017-09-10 ENCOUNTER — Encounter: Payer: Self-pay | Admitting: Orthopedic Surgery

## 2017-09-10 ENCOUNTER — Ambulatory Visit (INDEPENDENT_AMBULATORY_CARE_PROVIDER_SITE_OTHER): Payer: Medicare Other

## 2017-09-10 VITALS — BP 150/81 | HR 73 | Ht 61.0 in | Wt 231.0 lb

## 2017-09-10 DIAGNOSIS — Z96652 Presence of left artificial knee joint: Secondary | ICD-10-CM | POA: Diagnosis not present

## 2017-09-10 DIAGNOSIS — M1711 Unilateral primary osteoarthritis, right knee: Secondary | ICD-10-CM | POA: Diagnosis not present

## 2017-09-10 DIAGNOSIS — M25561 Pain in right knee: Secondary | ICD-10-CM | POA: Diagnosis not present

## 2017-09-10 MED ORDER — MELOXICAM 7.5 MG PO TABS: 7.5000 mg | ORAL_TABLET | Freq: Every day | ORAL | 5 refills | Status: DC

## 2017-09-10 NOTE — Progress Notes (Signed)
[name], female   DOB: 1946-03-16, 71 y.o.   MRN: [id number] New patient.   Chief Complaint  Patient presents with  . Knee Pain    right side    71 year old female presents for evaluation of right knee pain.  The patient had a left knee unicondylar knee replacement about 5 years ago  She presents with complaints of pain medial side right knee which is dull achy moderate in severity constant and of several weeks duration not several years. No additional trauma has been noted in the right knee.    Review of Systems  Respiratory: Negative for shortness of breath.   Cardiovascular: Negative for chest pain.  Musculoskeletal: Positive for joint pain.    Past Medical History:  Diagnosis Date  . Arthritis   . Depression   . GERD (gastroesophageal reflux disease)   . Hypertension   . Osteoporosis   . Thyroid disease     Past Surgical History:  Procedure Laterality Date  . BREAST BIOPSY Left   . CARPAL TUNNEL RELEASE Left   . KNEE SURGERY Bilateral   . PARTIAL HYSTERECTOMY  1975    PHYSICAL EXAM  BP (!) 150/81   Pulse 73   Ht 5\' 1"  (1.549 m)   Wt 231 lb (104.8 kg)   BMI 43.65 kg/m  GENERAL appearance reveals no gross abnormalities, normal development grooming and hygiene   MENTAL STATUS we note that the patient is awake alert and oriented to person place and time MOOD/AFFECT ARE NORMAL   GAIT reveals No significant  limp in the effected limb  Ortho Exam   Left knee medial incision healed. Her flexion was measured 105 with goniometer she does have full extension the joint is stable motor exam normal no neurovascular deficits.  On the right knee has a 10 flexion contracture goniometer measured 92 of flexion  Knee was stable medial compartment was tender to palpation  VASC 2+ dorsalis pedis pulse normal capillary refill excellent warmth to the extremity  NEURO normal sensation and no pathologic reflexes  LYMPH deferred  noncontributory   IMAGING STUDIES  I have independently reviewed the x-rays and I interpreted the x-rays as follows:  X-ray shows moderate osteoarthritis of the knee  Dx   primary osteoarthritis of the  right knee  PLAN  recommend weight loss with a goal weight of 210 pounds to get her BMI 140  Continue exercises and start arthritis medication   Procedure note right knee injection verbal consent was obtained to inject right knee joint  Timeout was completed to confirm the site of injection  The medications used were 40 mg of Depo-Medrol and 1% lidocaine 3 cc  Anesthesia was provided by ethyl chloride and the skin was prepped with alcohol.  After cleaning the skin with alcohol a 20-gauge needle was used to inject the right knee joint. There were no complications. A sterile bandage was applied.  10:18 AM Arther Abbott, MD 09/10/2017

## 2017-09-16 DIAGNOSIS — Z23 Encounter for immunization: Secondary | ICD-10-CM | POA: Diagnosis not present

## 2017-10-26 DIAGNOSIS — R739 Hyperglycemia, unspecified: Secondary | ICD-10-CM | POA: Diagnosis not present

## 2017-10-26 DIAGNOSIS — E782 Mixed hyperlipidemia: Secondary | ICD-10-CM | POA: Diagnosis not present

## 2017-10-26 DIAGNOSIS — E039 Hypothyroidism, unspecified: Secondary | ICD-10-CM | POA: Diagnosis not present

## 2017-10-26 DIAGNOSIS — M1990 Unspecified osteoarthritis, unspecified site: Secondary | ICD-10-CM | POA: Diagnosis not present

## 2017-10-26 DIAGNOSIS — I10 Essential (primary) hypertension: Secondary | ICD-10-CM | POA: Diagnosis not present

## 2017-10-26 DIAGNOSIS — G2581 Restless legs syndrome: Secondary | ICD-10-CM | POA: Diagnosis not present

## 2017-10-26 DIAGNOSIS — K219 Gastro-esophageal reflux disease without esophagitis: Secondary | ICD-10-CM | POA: Diagnosis not present

## 2017-11-02 DIAGNOSIS — R609 Edema, unspecified: Secondary | ICD-10-CM | POA: Diagnosis not present

## 2017-11-02 DIAGNOSIS — E782 Mixed hyperlipidemia: Secondary | ICD-10-CM | POA: Diagnosis not present

## 2017-11-02 DIAGNOSIS — R739 Hyperglycemia, unspecified: Secondary | ICD-10-CM | POA: Diagnosis not present

## 2017-11-02 DIAGNOSIS — I10 Essential (primary) hypertension: Secondary | ICD-10-CM | POA: Diagnosis not present

## 2017-11-02 DIAGNOSIS — E039 Hypothyroidism, unspecified: Secondary | ICD-10-CM | POA: Diagnosis not present

## 2017-11-02 DIAGNOSIS — G2581 Restless legs syndrome: Secondary | ICD-10-CM | POA: Diagnosis not present

## 2017-11-02 DIAGNOSIS — K219 Gastro-esophageal reflux disease without esophagitis: Secondary | ICD-10-CM | POA: Diagnosis not present

## 2017-11-02 DIAGNOSIS — M1990 Unspecified osteoarthritis, unspecified site: Secondary | ICD-10-CM | POA: Diagnosis not present

## 2017-11-28 DIAGNOSIS — H40023 Open angle with borderline findings, high risk, bilateral: Secondary | ICD-10-CM | POA: Diagnosis not present

## 2018-03-04 DIAGNOSIS — Z1212 Encounter for screening for malignant neoplasm of rectum: Secondary | ICD-10-CM | POA: Diagnosis not present

## 2018-03-04 DIAGNOSIS — N8111 Cystocele, midline: Secondary | ICD-10-CM | POA: Diagnosis not present

## 2018-03-04 DIAGNOSIS — Z1231 Encounter for screening mammogram for malignant neoplasm of breast: Secondary | ICD-10-CM | POA: Diagnosis not present

## 2018-03-04 DIAGNOSIS — N952 Postmenopausal atrophic vaginitis: Secondary | ICD-10-CM | POA: Diagnosis not present

## 2018-03-27 ENCOUNTER — Telehealth: Payer: Self-pay | Admitting: Orthopedic Surgery

## 2018-03-27 NOTE — Telephone Encounter (Signed)
Wt 231 / I called her to advise.

## 2018-03-27 NOTE — Telephone Encounter (Signed)
Patient called and would like to speak with a clinical person, she has some question about her weight on her last visit. She stated if she does not answer please leave a message.

## 2018-04-10 DIAGNOSIS — G4752 REM sleep behavior disorder: Secondary | ICD-10-CM | POA: Diagnosis not present

## 2018-04-10 DIAGNOSIS — G2581 Restless legs syndrome: Secondary | ICD-10-CM | POA: Diagnosis not present

## 2018-04-10 DIAGNOSIS — J3089 Other allergic rhinitis: Secondary | ICD-10-CM | POA: Diagnosis not present

## 2018-04-10 DIAGNOSIS — G4733 Obstructive sleep apnea (adult) (pediatric): Secondary | ICD-10-CM | POA: Diagnosis not present

## 2018-04-29 DIAGNOSIS — E039 Hypothyroidism, unspecified: Secondary | ICD-10-CM | POA: Diagnosis not present

## 2018-04-29 DIAGNOSIS — K219 Gastro-esophageal reflux disease without esophagitis: Secondary | ICD-10-CM | POA: Diagnosis not present

## 2018-04-29 DIAGNOSIS — R739 Hyperglycemia, unspecified: Secondary | ICD-10-CM | POA: Diagnosis not present

## 2018-04-29 DIAGNOSIS — E782 Mixed hyperlipidemia: Secondary | ICD-10-CM | POA: Diagnosis not present

## 2018-04-29 DIAGNOSIS — G2581 Restless legs syndrome: Secondary | ICD-10-CM | POA: Diagnosis not present

## 2018-04-29 DIAGNOSIS — I10 Essential (primary) hypertension: Secondary | ICD-10-CM | POA: Diagnosis not present

## 2018-04-29 DIAGNOSIS — M1990 Unspecified osteoarthritis, unspecified site: Secondary | ICD-10-CM | POA: Diagnosis not present

## 2018-04-30 DIAGNOSIS — G4752 REM sleep behavior disorder: Secondary | ICD-10-CM | POA: Diagnosis not present

## 2018-04-30 DIAGNOSIS — G4733 Obstructive sleep apnea (adult) (pediatric): Secondary | ICD-10-CM | POA: Diagnosis not present

## 2018-05-06 DIAGNOSIS — Z1389 Encounter for screening for other disorder: Secondary | ICD-10-CM | POA: Diagnosis not present

## 2018-05-06 DIAGNOSIS — R609 Edema, unspecified: Secondary | ICD-10-CM | POA: Diagnosis not present

## 2018-05-06 DIAGNOSIS — E669 Obesity, unspecified: Secondary | ICD-10-CM | POA: Diagnosis not present

## 2018-05-06 DIAGNOSIS — E782 Mixed hyperlipidemia: Secondary | ICD-10-CM | POA: Diagnosis not present

## 2018-05-06 DIAGNOSIS — Z1331 Encounter for screening for depression: Secondary | ICD-10-CM | POA: Diagnosis not present

## 2018-05-06 DIAGNOSIS — I10 Essential (primary) hypertension: Secondary | ICD-10-CM | POA: Diagnosis not present

## 2018-05-06 DIAGNOSIS — E039 Hypothyroidism, unspecified: Secondary | ICD-10-CM | POA: Diagnosis not present

## 2018-05-06 DIAGNOSIS — R739 Hyperglycemia, unspecified: Secondary | ICD-10-CM | POA: Diagnosis not present

## 2018-05-28 DIAGNOSIS — S0502XA Injury of conjunctiva and corneal abrasion without foreign body, left eye, initial encounter: Secondary | ICD-10-CM | POA: Diagnosis not present

## 2018-06-04 DIAGNOSIS — E039 Hypothyroidism, unspecified: Secondary | ICD-10-CM | POA: Diagnosis not present

## 2018-07-11 DIAGNOSIS — D225 Melanocytic nevi of trunk: Secondary | ICD-10-CM | POA: Diagnosis not present

## 2018-07-11 DIAGNOSIS — Z1283 Encounter for screening for malignant neoplasm of skin: Secondary | ICD-10-CM | POA: Diagnosis not present

## 2018-07-11 DIAGNOSIS — L308 Other specified dermatitis: Secondary | ICD-10-CM | POA: Diagnosis not present

## 2018-07-17 ENCOUNTER — Other Ambulatory Visit: Payer: Self-pay

## 2018-09-21 DIAGNOSIS — Z23 Encounter for immunization: Secondary | ICD-10-CM | POA: Diagnosis not present

## 2018-10-25 DIAGNOSIS — W010XXA Fall on same level from slipping, tripping and stumbling without subsequent striking against object, initial encounter: Secondary | ICD-10-CM | POA: Diagnosis not present

## 2018-10-25 DIAGNOSIS — Z79899 Other long term (current) drug therapy: Secondary | ICD-10-CM | POA: Diagnosis not present

## 2018-10-25 DIAGNOSIS — R51 Headache: Secondary | ICD-10-CM | POA: Diagnosis not present

## 2018-10-25 DIAGNOSIS — S0181XA Laceration without foreign body of other part of head, initial encounter: Secondary | ICD-10-CM | POA: Diagnosis not present

## 2018-10-25 DIAGNOSIS — F329 Major depressive disorder, single episode, unspecified: Secondary | ICD-10-CM | POA: Diagnosis not present

## 2018-10-25 DIAGNOSIS — S0990XA Unspecified injury of head, initial encounter: Secondary | ICD-10-CM | POA: Diagnosis not present

## 2018-10-25 DIAGNOSIS — S078XXA Crushing injury of other parts of head, initial encounter: Secondary | ICD-10-CM | POA: Diagnosis not present

## 2018-10-25 DIAGNOSIS — I10 Essential (primary) hypertension: Secondary | ICD-10-CM | POA: Diagnosis not present

## 2018-10-25 DIAGNOSIS — E7800 Pure hypercholesterolemia, unspecified: Secondary | ICD-10-CM | POA: Diagnosis not present

## 2018-10-25 DIAGNOSIS — K219 Gastro-esophageal reflux disease without esophagitis: Secondary | ICD-10-CM | POA: Diagnosis not present

## 2018-10-25 DIAGNOSIS — Y92009 Unspecified place in unspecified non-institutional (private) residence as the place of occurrence of the external cause: Secondary | ICD-10-CM | POA: Diagnosis not present

## 2018-10-28 DIAGNOSIS — K219 Gastro-esophageal reflux disease without esophagitis: Secondary | ICD-10-CM | POA: Diagnosis not present

## 2018-10-28 DIAGNOSIS — R739 Hyperglycemia, unspecified: Secondary | ICD-10-CM | POA: Diagnosis not present

## 2018-10-28 DIAGNOSIS — E782 Mixed hyperlipidemia: Secondary | ICD-10-CM | POA: Diagnosis not present

## 2018-10-28 DIAGNOSIS — M1990 Unspecified osteoarthritis, unspecified site: Secondary | ICD-10-CM | POA: Diagnosis not present

## 2018-10-28 DIAGNOSIS — I10 Essential (primary) hypertension: Secondary | ICD-10-CM | POA: Diagnosis not present

## 2018-10-28 DIAGNOSIS — E039 Hypothyroidism, unspecified: Secondary | ICD-10-CM | POA: Diagnosis not present

## 2018-10-28 DIAGNOSIS — G2581 Restless legs syndrome: Secondary | ICD-10-CM | POA: Diagnosis not present

## 2018-11-01 DIAGNOSIS — R739 Hyperglycemia, unspecified: Secondary | ICD-10-CM | POA: Diagnosis not present

## 2018-11-01 DIAGNOSIS — K219 Gastro-esophageal reflux disease without esophagitis: Secondary | ICD-10-CM | POA: Diagnosis not present

## 2018-11-01 DIAGNOSIS — M1990 Unspecified osteoarthritis, unspecified site: Secondary | ICD-10-CM | POA: Diagnosis not present

## 2018-11-01 DIAGNOSIS — G2581 Restless legs syndrome: Secondary | ICD-10-CM | POA: Diagnosis not present

## 2018-11-01 DIAGNOSIS — E039 Hypothyroidism, unspecified: Secondary | ICD-10-CM | POA: Diagnosis not present

## 2018-11-01 DIAGNOSIS — E782 Mixed hyperlipidemia: Secondary | ICD-10-CM | POA: Diagnosis not present

## 2018-11-01 DIAGNOSIS — I10 Essential (primary) hypertension: Secondary | ICD-10-CM | POA: Diagnosis not present

## 2018-11-01 DIAGNOSIS — R609 Edema, unspecified: Secondary | ICD-10-CM | POA: Diagnosis not present

## 2018-11-06 ENCOUNTER — Other Ambulatory Visit: Payer: Self-pay

## 2018-11-27 DIAGNOSIS — Z961 Presence of intraocular lens: Secondary | ICD-10-CM | POA: Diagnosis not present

## 2018-11-27 DIAGNOSIS — H40023 Open angle with borderline findings, high risk, bilateral: Secondary | ICD-10-CM | POA: Diagnosis not present

## 2019-01-14 DIAGNOSIS — J209 Acute bronchitis, unspecified: Secondary | ICD-10-CM | POA: Diagnosis not present

## 2019-01-14 DIAGNOSIS — Z6841 Body Mass Index (BMI) 40.0 and over, adult: Secondary | ICD-10-CM | POA: Diagnosis not present

## 2019-01-31 DIAGNOSIS — G2581 Restless legs syndrome: Secondary | ICD-10-CM | POA: Diagnosis not present

## 2019-01-31 DIAGNOSIS — G4752 REM sleep behavior disorder: Secondary | ICD-10-CM | POA: Diagnosis not present

## 2019-02-28 ENCOUNTER — Ambulatory Visit (INDEPENDENT_AMBULATORY_CARE_PROVIDER_SITE_OTHER): Payer: Medicare Other | Admitting: Orthopedic Surgery

## 2019-02-28 ENCOUNTER — Other Ambulatory Visit: Payer: Self-pay

## 2019-02-28 ENCOUNTER — Ambulatory Visit (INDEPENDENT_AMBULATORY_CARE_PROVIDER_SITE_OTHER): Payer: Medicare Other

## 2019-02-28 ENCOUNTER — Encounter: Payer: Self-pay | Admitting: Orthopedic Surgery

## 2019-02-28 VITALS — BP 140/80 | HR 86 | Ht 61.0 in | Wt 222.0 lb

## 2019-02-28 DIAGNOSIS — M25561 Pain in right knee: Secondary | ICD-10-CM

## 2019-02-28 DIAGNOSIS — G8929 Other chronic pain: Secondary | ICD-10-CM | POA: Diagnosis not present

## 2019-02-28 MED ORDER — TRAMADOL HCL 50 MG PO TABS: 50.0000 mg | ORAL_TABLET | Freq: Four times a day (QID) | ORAL | 0 refills | Status: DC | PRN

## 2019-02-28 NOTE — Patient Instructions (Signed)
Start tramadol 1 every 6 as needed for pain  Continue efforts at weight loss  Pain left hand  Aloe up 3 weeks

## 2019-02-28 NOTE — Progress Notes (Addendum)
ESTABLISHED PATIENT NEW PROBLEM OFFICE VISIT  Chief Complaint  Patient presents with  . Knee Pain    right     73 year old female follows up for evaluation of her right knee she has osteoarthritis of the right knee she twisted in bed felt a pop and since that time is had increased pain stiffness and developed a limp favoring her right lower extremity.  Her BMI is currently 41.95  She is not taking any medications.  She is NSAID intolerant due to reflux  She is not using a cane or any assistive devices.  She does do water aerobics.  She is concerned because she cannot walk and her activities of daily living were declining even before she twisted her knee   Review of Systems  Constitutional: Positive for weight loss.  Musculoskeletal: Positive for joint pain. Negative for back pain.  Neurological: Negative for tingling and sensory change.     Past Medical History:  Diagnosis Date  . Arthritis   . Depression   . GERD (gastroesophageal reflux disease)   . Hypertension   . Osteoporosis   . Thyroid disease     Past Surgical History:  Procedure Laterality Date  . BREAST BIOPSY Left   . CARPAL TUNNEL RELEASE Left   . KNEE SURGERY Bilateral   . PARTIAL HYSTERECTOMY  1975    Family History  Problem Relation Age of Onset  . Diabetes Mother   . Cancer Mother   . Diabetes Father   . Cancer Father    Social History   Tobacco Use  . Smoking status: Never Smoker  . Smokeless tobacco: Never Used  Substance Use Topics  . Alcohol use: No  . Drug use: No    Allergies  Allergen Reactions  . Sulfa Antibiotics     Current Meds  Medication Sig  . Cholecalciferol (VITAMIN D3 PO) Take by mouth.  . citalopram (CELEXA) 20 MG tablet   . clonazePAM (KLONOPIN) 1 MG tablet   . hydrochlorothiazide (HYDRODIURIL) 25 MG tablet daily.  Marland Kitchen levothyroxine (SYNTHROID, LEVOTHROID) 50 MCG tablet Take 50 mcg by mouth daily before breakfast.  . omeprazole (PRILOSEC) 20 MG capsule daily.  .  simvastatin (ZOCOR) 40 MG tablet daily.    BP 140/80   Pulse 86   Ht 5\' 1"  (1.549 m)   Wt 222 lb (100.7 kg)   BMI 41.95 kg/m   Physical Exam Vitals signs and nursing note reviewed.  Constitutional:      Appearance: She is obese. She is not ill-appearing.  Neurological:     Mental Status: She is alert and oriented to person, place, and time.     Sensory: No sensory deficit.     Coordination: Coordination normal.     Gait: Gait abnormal.     Deep Tendon Reflexes: Reflexes normal.  Psychiatric:        Mood and Affect: Mood normal.        Thought Content: Thought content normal.     Ortho Exam  Left knee has a incision from her prior unicondylar arthroplasty.  It is functioning well with excellent range of motion no swelling stable neurovascular exam is intact muscle tone is normal strength is excellent  Right knee tenderness is diffuse including the superior capsular area and peripatellar region medial joint line as well.  Flexion is 120 degrees extension is 5 degrees contracture Muscle tone and strength are normal Ligaments are stable Rotational stress tests are negative for McMurray's test Neurovascular exam is intact Skin  looks clean  MEDICAL DECISION SECTION  Xrays were done at [facility] orthopedics Severe arthritis varus mild/moderate right knee   Encounter Diagnoses  Name Primary?  . Chronic pain of right knee   . Acute pain of right knee Yes    PLAN:  I attempted aspiration of her right knee was unsuccessful but I did inject it with Depo-Medrol  Procedure note right knee injection verbal consent was obtained to inject right knee joint  Timeout was completed to confirm the site of injection  The medications used were 40 mg of Depo-Medrol and 1% lidocaine 3 cc  Anesthesia was provided by ethyl chloride and the skin was prepped with alcohol.  After cleaning the skin with alcohol a 20-gauge needle was used to inject the right knee joint. There were no  complications. A sterile bandage was applied.  Start tramadol 50 mg every 6  Follow-up 3 months  Meds ordered this encounter  Medications  . traMADol (ULTRAM) 50 MG tablet    Sig: Take 1 tablet (50 mg total) by mouth every 6 (six) hours as needed for up to 5 days.    Dispense:  20 tablet    Refill:  0    Arther Abbott, MD  02/28/2019 9:48 AM

## 2019-03-03 ENCOUNTER — Other Ambulatory Visit: Payer: Self-pay | Admitting: Orthopedic Surgery

## 2019-03-03 DIAGNOSIS — G8929 Other chronic pain: Secondary | ICD-10-CM

## 2019-03-03 DIAGNOSIS — R739 Hyperglycemia, unspecified: Secondary | ICD-10-CM | POA: Diagnosis not present

## 2019-03-03 DIAGNOSIS — K219 Gastro-esophageal reflux disease without esophagitis: Secondary | ICD-10-CM | POA: Diagnosis not present

## 2019-03-03 DIAGNOSIS — E782 Mixed hyperlipidemia: Secondary | ICD-10-CM | POA: Diagnosis not present

## 2019-03-03 DIAGNOSIS — E039 Hypothyroidism, unspecified: Secondary | ICD-10-CM | POA: Diagnosis not present

## 2019-03-03 DIAGNOSIS — I10 Essential (primary) hypertension: Secondary | ICD-10-CM | POA: Diagnosis not present

## 2019-03-03 DIAGNOSIS — M25561 Pain in right knee: Secondary | ICD-10-CM

## 2019-03-03 MED ORDER — TRAMADOL HCL 50 MG PO TABS: 50.0000 mg | ORAL_TABLET | Freq: Four times a day (QID) | ORAL | 0 refills | Status: AC | PRN

## 2019-03-06 DIAGNOSIS — Z1231 Encounter for screening mammogram for malignant neoplasm of breast: Secondary | ICD-10-CM | POA: Diagnosis not present

## 2019-03-07 DIAGNOSIS — K219 Gastro-esophageal reflux disease without esophagitis: Secondary | ICD-10-CM | POA: Diagnosis not present

## 2019-03-07 DIAGNOSIS — E039 Hypothyroidism, unspecified: Secondary | ICD-10-CM | POA: Diagnosis not present

## 2019-03-07 DIAGNOSIS — I10 Essential (primary) hypertension: Secondary | ICD-10-CM | POA: Diagnosis not present

## 2019-03-07 DIAGNOSIS — Z6841 Body Mass Index (BMI) 40.0 and over, adult: Secondary | ICD-10-CM | POA: Diagnosis not present

## 2019-03-07 DIAGNOSIS — E782 Mixed hyperlipidemia: Secondary | ICD-10-CM | POA: Diagnosis not present

## 2019-03-07 DIAGNOSIS — G2581 Restless legs syndrome: Secondary | ICD-10-CM | POA: Diagnosis not present

## 2019-03-07 DIAGNOSIS — R739 Hyperglycemia, unspecified: Secondary | ICD-10-CM | POA: Diagnosis not present

## 2019-03-14 DIAGNOSIS — Z1212 Encounter for screening for malignant neoplasm of rectum: Secondary | ICD-10-CM | POA: Diagnosis not present

## 2019-03-14 DIAGNOSIS — N8111 Cystocele, midline: Secondary | ICD-10-CM | POA: Diagnosis not present

## 2019-03-14 DIAGNOSIS — E669 Obesity, unspecified: Secondary | ICD-10-CM | POA: Diagnosis not present

## 2019-03-14 DIAGNOSIS — N952 Postmenopausal atrophic vaginitis: Secondary | ICD-10-CM | POA: Diagnosis not present

## 2019-03-21 ENCOUNTER — Other Ambulatory Visit: Payer: Self-pay

## 2019-03-21 ENCOUNTER — Ambulatory Visit (INDEPENDENT_AMBULATORY_CARE_PROVIDER_SITE_OTHER): Payer: Medicare Other | Admitting: Orthopedic Surgery

## 2019-03-21 DIAGNOSIS — M25561 Pain in right knee: Secondary | ICD-10-CM

## 2019-03-21 DIAGNOSIS — G8929 Other chronic pain: Secondary | ICD-10-CM | POA: Diagnosis not present

## 2019-03-21 DIAGNOSIS — M1711 Unilateral primary osteoarthritis, right knee: Secondary | ICD-10-CM | POA: Diagnosis not present

## 2019-03-21 NOTE — Progress Notes (Signed)
Virtual Visit via Telephone Note  I connected with Sherri Sandoval on 03/21/19 at 11:30 AM EDT by telephone and verified that I am speaking with the correct person using two identifiers.   I discussed the limitations, risks, security and privacy concerns of performing an evaluation and management service by telephone and the availability of in person appointments. I also discussed with the patient that there may be a patient responsible charge related to this service. The patient expressed understanding and agreed to proceed.    I discussed the assessment and treatment plan with the patient. The patient was provided an opportunity to ask questions and all were answered. The patient agreed with the plan and demonstrated an understanding of the instructions.   The patient was advised to call back or seek an in-person evaluation if the symptoms worsen or if the condition fails to improve as anticipated.  I provided 8 minutes of non-face-to-face time during this encounter.   Arther Abbott, MD   Progress Note   [name], female   DOB: 1946/06/06, 73 y.o.   MRN: [id number]   Chief Complaint  Patient presents with  . Knee Pain    right knee pain /virtual visit     73 year old female follows up for evaluation of her right knee she has osteoarthritis of the right knee she twisted in bed felt a pop and since that time is had increased pain stiffness and developed a limp favoring her right lower extremity.  Her BMI is currently 41.95  She is not taking any medications.  She is NSAID intolerant due to reflux  She is not using a cane or any assistive devices.  She does do water aerobics.  She is concerned because she cannot walk and her activities of daily living were declining even before she twisted her knee   LOTS OF KNEE PAIN, NOT THE PAIN, DIFFERENT KIND OF PAIN PAIN WITH WALKING AND TURNING OVER IN BED     Review of Systems  Neurological: Negative for  tingling, sensory change and focal weakness.     Allergies  Allergen Reactions  . Sulfa Antibiotics      There were no vitals taken for this visit.  Physical Exam   Medical decisions:   Data  Imaging: 02/28/2019  3 views right knee  Acute pain right knee on chronic pain right knee  X-ray was taken in 2018 repeated today shows advanced arthritis of the medial compartment moderate varus deformity multiple osteophytes medially patellofemoral area and posterior joint  Impression severe arthritis right knee moderate varus deformity  Encounter Diagnoses  Name Primary?  . Chronic pain of right knee Yes  . Primary osteoarthritis of right knee     PLAN:   [place] 1 MONTH    Arther Abbott, MD 03/21/2019 11:28 AM

## 2019-04-10 DIAGNOSIS — G4733 Obstructive sleep apnea (adult) (pediatric): Secondary | ICD-10-CM | POA: Diagnosis not present

## 2019-04-10 DIAGNOSIS — G2581 Restless legs syndrome: Secondary | ICD-10-CM | POA: Diagnosis not present

## 2019-04-10 DIAGNOSIS — G4752 REM sleep behavior disorder: Secondary | ICD-10-CM | POA: Diagnosis not present

## 2019-04-28 ENCOUNTER — Ambulatory Visit (INDEPENDENT_AMBULATORY_CARE_PROVIDER_SITE_OTHER): Payer: Medicare Other | Admitting: Orthopedic Surgery

## 2019-04-28 ENCOUNTER — Other Ambulatory Visit: Payer: Self-pay

## 2019-04-28 DIAGNOSIS — M1711 Unilateral primary osteoarthritis, right knee: Secondary | ICD-10-CM | POA: Diagnosis not present

## 2019-04-28 NOTE — Progress Notes (Signed)
  Chief complaint pain right knee  History 73 year old female with osteoarthritis right knee.  She received an injection.  Her knee flexion improved but she still has significant knee pain trouble with activities of daily living her gait is requiring use of a cane  Review of systems progressively increasing back pain since her knee started bothering her  Medical decision making data  Her x-ray previously taken shows severe arthritis of the right knee with a varus deformity which is moderate she has a significant secondary bone changes  Her osteoarthritis is worsening  Plan is for her to come into the office for work-up for her lumbar spine with an x-ray, reevaluate her right knee and plan for surgery when she can do it  5 minutes 52 seconds a virtual phone visit  Virtual Visit via Telephone Note  I connected with Luther Hearing on 04/28/19 at  2:00 PM EDT by telephone and verified that I am speaking with the correct person using two identifiers.  Location: Patient home Provider: Office   I discussed the limitations, risks, security and privacy concerns of performing an evaluation and management service by telephone and the availability of in person appointments. I also discussed with the patient that there may be a patient responsible charge related to this service. The patient expressed understanding and agreed to proceed.      I discussed the assessment and treatment plan with the patient. The patient was provided an opportunity to ask questions and all were answered. The patient agreed with the plan and demonstrated an understanding of the instructions.   The patient was advised to call back or seek an in-person evaluation if the symptoms worsen or if the condition fails to improve as anticipated.  I provided 5 minutes 52 seconds of non-face-to-face time during this encounter.   Arther Abbott, MD

## 2019-05-21 ENCOUNTER — Encounter: Payer: Self-pay | Admitting: Orthopedic Surgery

## 2019-05-21 ENCOUNTER — Other Ambulatory Visit: Payer: Self-pay

## 2019-05-21 ENCOUNTER — Ambulatory Visit (INDEPENDENT_AMBULATORY_CARE_PROVIDER_SITE_OTHER): Payer: Medicare Other

## 2019-05-21 ENCOUNTER — Ambulatory Visit (INDEPENDENT_AMBULATORY_CARE_PROVIDER_SITE_OTHER): Payer: Medicare Other | Admitting: Orthopedic Surgery

## 2019-05-21 VITALS — BP 140/83 | HR 85 | Temp 98.1°F | Ht 61.0 in | Wt 232.0 lb

## 2019-05-21 DIAGNOSIS — M47816 Spondylosis without myelopathy or radiculopathy, lumbar region: Secondary | ICD-10-CM | POA: Diagnosis not present

## 2019-05-21 DIAGNOSIS — M1711 Unilateral primary osteoarthritis, right knee: Secondary | ICD-10-CM

## 2019-05-21 DIAGNOSIS — G8929 Other chronic pain: Secondary | ICD-10-CM

## 2019-05-21 DIAGNOSIS — M5441 Lumbago with sciatica, right side: Secondary | ICD-10-CM | POA: Diagnosis not present

## 2019-05-21 DIAGNOSIS — Z6841 Body Mass Index (BMI) 40.0 and over, adult: Secondary | ICD-10-CM

## 2019-05-21 NOTE — Patient Instructions (Signed)
Panel costs when you are ready to have knee surgery

## 2019-05-21 NOTE — Progress Notes (Signed)
Progress Note   [name], female   DOB: 1946/11/27, 73 y.o.   MRN: [id number]   Chief Complaint  Patient presents with  . Knee Pain    Rt knee pain is causing lower back pain  . Back Pain    72 considering right total knee trying to lose weight to get her BMI down to 40 presents for evaluation of ongoing right lower back pain which is exacerbated by standing    Review of Systems  Musculoskeletal: Positive for back pain and joint pain.  Neurological: Negative for tingling, sensory change and focal weakness.    Past Medical History:  Diagnosis Date  . Arthritis   . Depression   . GERD (gastroesophageal reflux disease)   . Hypertension   . Osteoporosis   . Thyroid disease    cpap at night  Hemoglobin A1c usually less than 7   Allergies  Allergen Reactions  . Sulfa Antibiotics      BP 140/83   Pulse 85   Temp 98.1 F (36.7 C)   Ht 5\' 1"  (1.549 m)   Wt 232 lb (105.2 kg)   BMI 43.84 kg/m   Physical Exam Vitals signs and nursing note reviewed.  Constitutional:      Appearance: Normal appearance.  Musculoskeletal:       Arms:  Neurological:     Mental Status: She is alert and oriented to person, place, and time.  Psychiatric:        Mood and Affect: Mood normal.      Medical decisions:   Data  Imaging:   Lumbar x-rays show spondylosis L4-S1 excessive lumbar lordosis please see dictated report  Encounter Diagnosis  Name Primary?  . Chronic midline low back pain with right-sided sciatica Yes  The patient meets the AMA guidelines for Morbid (severe) obesity with a BMI > 40.0 and I have recommended weight loss.   PLAN:   We talked about potentially having the knee surgery but right now her sister is undergoing some medical procedures and that is the president will help her at home  She is to undergo right total knee  Her hemoglobin A1c has been less than 7 she is prediabetic  She does use a CPAP at night but does not have  COPD or other respiratory issues  She understands that doing surgery with her weight may increase her risk but she is willing to undergo the surgery because of the pain she is having    Arther Abbott, MD 05/21/2019 2:29 PM

## 2019-06-11 ENCOUNTER — Telehealth: Payer: Self-pay | Admitting: Orthopedic Surgery

## 2019-06-11 NOTE — Telephone Encounter (Signed)
Patient requests notes and reports related to knee, and surgery discussed with Dr Aline Brochure; states due to family who would be able to take care of her post op, [place] Aware to sign release authorization form.

## 2019-06-24 DIAGNOSIS — Z96652 Presence of left artificial knee joint: Secondary | ICD-10-CM | POA: Diagnosis not present

## 2019-06-24 DIAGNOSIS — M25561 Pain in right knee: Secondary | ICD-10-CM | POA: Diagnosis not present

## 2019-06-24 DIAGNOSIS — M1711 Unilateral primary osteoarthritis, right knee: Secondary | ICD-10-CM | POA: Diagnosis not present

## 2019-07-04 DIAGNOSIS — Z01818 Encounter for other preprocedural examination: Secondary | ICD-10-CM | POA: Diagnosis not present

## 2019-07-04 DIAGNOSIS — M1711 Unilateral primary osteoarthritis, right knee: Secondary | ICD-10-CM | POA: Diagnosis not present

## 2019-07-04 DIAGNOSIS — M25561 Pain in right knee: Secondary | ICD-10-CM | POA: Diagnosis not present

## 2019-07-07 DIAGNOSIS — M1711 Unilateral primary osteoarthritis, right knee: Secondary | ICD-10-CM | POA: Diagnosis not present

## 2019-07-07 DIAGNOSIS — Z6841 Body Mass Index (BMI) 40.0 and over, adult: Secondary | ICD-10-CM | POA: Diagnosis not present

## 2019-07-15 DIAGNOSIS — R079 Chest pain, unspecified: Secondary | ICD-10-CM | POA: Diagnosis not present

## 2019-07-15 DIAGNOSIS — Z01810 Encounter for preprocedural cardiovascular examination: Secondary | ICD-10-CM | POA: Diagnosis not present

## 2019-07-19 HISTORY — PX: KNEE SURGERY: SHX244

## 2019-07-25 DIAGNOSIS — M1711 Unilateral primary osteoarthritis, right knee: Secondary | ICD-10-CM | POA: Diagnosis not present

## 2019-07-25 DIAGNOSIS — Z20828 Contact with and (suspected) exposure to other viral communicable diseases: Secondary | ICD-10-CM | POA: Diagnosis not present

## 2019-07-25 DIAGNOSIS — Z01818 Encounter for other preprocedural examination: Secondary | ICD-10-CM | POA: Diagnosis not present

## 2019-07-28 DIAGNOSIS — M1711 Unilateral primary osteoarthritis, right knee: Secondary | ICD-10-CM | POA: Insufficient documentation

## 2019-07-28 DIAGNOSIS — Z882 Allergy status to sulfonamides status: Secondary | ICD-10-CM | POA: Diagnosis not present

## 2019-07-28 DIAGNOSIS — E785 Hyperlipidemia, unspecified: Secondary | ICD-10-CM | POA: Diagnosis not present

## 2019-07-28 DIAGNOSIS — I10 Essential (primary) hypertension: Secondary | ICD-10-CM | POA: Diagnosis not present

## 2019-07-28 DIAGNOSIS — E039 Hypothyroidism, unspecified: Secondary | ICD-10-CM | POA: Diagnosis not present

## 2019-07-28 DIAGNOSIS — Z961 Presence of intraocular lens: Secondary | ICD-10-CM | POA: Diagnosis not present

## 2019-07-28 DIAGNOSIS — Z9841 Cataract extraction status, right eye: Secondary | ICD-10-CM | POA: Diagnosis not present

## 2019-07-28 DIAGNOSIS — Z791 Long term (current) use of non-steroidal anti-inflammatories (NSAID): Secondary | ICD-10-CM | POA: Diagnosis not present

## 2019-07-28 DIAGNOSIS — G4730 Sleep apnea, unspecified: Secondary | ICD-10-CM | POA: Diagnosis not present

## 2019-07-28 DIAGNOSIS — G8918 Other acute postprocedural pain: Secondary | ICD-10-CM | POA: Diagnosis not present

## 2019-07-28 DIAGNOSIS — M25761 Osteophyte, right knee: Secondary | ICD-10-CM | POA: Diagnosis not present

## 2019-07-28 DIAGNOSIS — Z79890 Hormone replacement therapy (postmenopausal): Secondary | ICD-10-CM | POA: Diagnosis not present

## 2019-07-28 DIAGNOSIS — Z9842 Cataract extraction status, left eye: Secondary | ICD-10-CM | POA: Diagnosis not present

## 2019-07-28 DIAGNOSIS — Z79899 Other long term (current) drug therapy: Secondary | ICD-10-CM | POA: Diagnosis not present

## 2019-07-28 DIAGNOSIS — K219 Gastro-esophageal reflux disease without esophagitis: Secondary | ICD-10-CM | POA: Diagnosis not present

## 2019-07-28 DIAGNOSIS — Z6841 Body Mass Index (BMI) 40.0 and over, adult: Secondary | ICD-10-CM | POA: Diagnosis not present

## 2019-07-29 DIAGNOSIS — I10 Essential (primary) hypertension: Secondary | ICD-10-CM | POA: Diagnosis not present

## 2019-07-29 DIAGNOSIS — G8918 Other acute postprocedural pain: Secondary | ICD-10-CM | POA: Diagnosis not present

## 2019-07-29 DIAGNOSIS — E785 Hyperlipidemia, unspecified: Secondary | ICD-10-CM | POA: Diagnosis not present

## 2019-07-29 DIAGNOSIS — G4730 Sleep apnea, unspecified: Secondary | ICD-10-CM | POA: Diagnosis not present

## 2019-07-29 DIAGNOSIS — E039 Hypothyroidism, unspecified: Secondary | ICD-10-CM | POA: Diagnosis not present

## 2019-07-29 DIAGNOSIS — M1711 Unilateral primary osteoarthritis, right knee: Secondary | ICD-10-CM | POA: Diagnosis not present

## 2019-07-29 DIAGNOSIS — M25761 Osteophyte, right knee: Secondary | ICD-10-CM | POA: Diagnosis not present

## 2019-07-30 DIAGNOSIS — M1711 Unilateral primary osteoarthritis, right knee: Secondary | ICD-10-CM | POA: Diagnosis not present

## 2019-07-30 DIAGNOSIS — M25761 Osteophyte, right knee: Secondary | ICD-10-CM | POA: Diagnosis not present

## 2019-07-30 DIAGNOSIS — E039 Hypothyroidism, unspecified: Secondary | ICD-10-CM | POA: Diagnosis not present

## 2019-07-30 DIAGNOSIS — E785 Hyperlipidemia, unspecified: Secondary | ICD-10-CM | POA: Diagnosis not present

## 2019-07-30 DIAGNOSIS — G8918 Other acute postprocedural pain: Secondary | ICD-10-CM | POA: Diagnosis not present

## 2019-07-30 DIAGNOSIS — I10 Essential (primary) hypertension: Secondary | ICD-10-CM | POA: Diagnosis not present

## 2019-07-30 DIAGNOSIS — G4730 Sleep apnea, unspecified: Secondary | ICD-10-CM | POA: Diagnosis not present

## 2019-08-01 DIAGNOSIS — Z6841 Body Mass Index (BMI) 40.0 and over, adult: Secondary | ICD-10-CM | POA: Diagnosis not present

## 2019-08-01 DIAGNOSIS — E039 Hypothyroidism, unspecified: Secondary | ICD-10-CM | POA: Diagnosis not present

## 2019-08-01 DIAGNOSIS — Z471 Aftercare following joint replacement surgery: Secondary | ICD-10-CM | POA: Diagnosis not present

## 2019-08-01 DIAGNOSIS — Z96652 Presence of left artificial knee joint: Secondary | ICD-10-CM | POA: Diagnosis not present

## 2019-08-01 DIAGNOSIS — Z7901 Long term (current) use of anticoagulants: Secondary | ICD-10-CM | POA: Diagnosis not present

## 2019-08-01 DIAGNOSIS — I10 Essential (primary) hypertension: Secondary | ICD-10-CM | POA: Diagnosis not present

## 2019-08-01 DIAGNOSIS — Z96651 Presence of right artificial knee joint: Secondary | ICD-10-CM | POA: Diagnosis not present

## 2019-08-01 DIAGNOSIS — I82890 Acute embolism and thrombosis of other specified veins: Secondary | ICD-10-CM | POA: Diagnosis not present

## 2019-08-04 DIAGNOSIS — Z471 Aftercare following joint replacement surgery: Secondary | ICD-10-CM | POA: Diagnosis not present

## 2019-08-04 DIAGNOSIS — I10 Essential (primary) hypertension: Secondary | ICD-10-CM | POA: Diagnosis not present

## 2019-08-04 DIAGNOSIS — Z6841 Body Mass Index (BMI) 40.0 and over, adult: Secondary | ICD-10-CM | POA: Diagnosis not present

## 2019-08-04 DIAGNOSIS — Z96652 Presence of left artificial knee joint: Secondary | ICD-10-CM | POA: Diagnosis not present

## 2019-08-04 DIAGNOSIS — Z96651 Presence of right artificial knee joint: Secondary | ICD-10-CM | POA: Diagnosis not present

## 2019-08-05 DIAGNOSIS — I10 Essential (primary) hypertension: Secondary | ICD-10-CM | POA: Diagnosis not present

## 2019-08-05 DIAGNOSIS — Z6841 Body Mass Index (BMI) 40.0 and over, adult: Secondary | ICD-10-CM | POA: Diagnosis not present

## 2019-08-05 DIAGNOSIS — Z96652 Presence of left artificial knee joint: Secondary | ICD-10-CM | POA: Diagnosis not present

## 2019-08-05 DIAGNOSIS — Z96651 Presence of right artificial knee joint: Secondary | ICD-10-CM | POA: Diagnosis not present

## 2019-08-05 DIAGNOSIS — Z471 Aftercare following joint replacement surgery: Secondary | ICD-10-CM | POA: Diagnosis not present

## 2019-08-06 DIAGNOSIS — I10 Essential (primary) hypertension: Secondary | ICD-10-CM | POA: Diagnosis not present

## 2019-08-06 DIAGNOSIS — Z6841 Body Mass Index (BMI) 40.0 and over, adult: Secondary | ICD-10-CM | POA: Diagnosis not present

## 2019-08-06 DIAGNOSIS — Z96652 Presence of left artificial knee joint: Secondary | ICD-10-CM | POA: Diagnosis not present

## 2019-08-06 DIAGNOSIS — Z471 Aftercare following joint replacement surgery: Secondary | ICD-10-CM | POA: Diagnosis not present

## 2019-08-06 DIAGNOSIS — Z96651 Presence of right artificial knee joint: Secondary | ICD-10-CM | POA: Diagnosis not present

## 2019-08-07 DIAGNOSIS — Z96651 Presence of right artificial knee joint: Secondary | ICD-10-CM | POA: Diagnosis not present

## 2019-08-07 DIAGNOSIS — Z96652 Presence of left artificial knee joint: Secondary | ICD-10-CM | POA: Diagnosis not present

## 2019-08-07 DIAGNOSIS — Z471 Aftercare following joint replacement surgery: Secondary | ICD-10-CM | POA: Diagnosis not present

## 2019-08-07 DIAGNOSIS — Z6841 Body Mass Index (BMI) 40.0 and over, adult: Secondary | ICD-10-CM | POA: Diagnosis not present

## 2019-08-07 DIAGNOSIS — I10 Essential (primary) hypertension: Secondary | ICD-10-CM | POA: Diagnosis not present

## 2019-08-11 DIAGNOSIS — Z96651 Presence of right artificial knee joint: Secondary | ICD-10-CM | POA: Diagnosis not present

## 2019-08-11 DIAGNOSIS — Z6841 Body Mass Index (BMI) 40.0 and over, adult: Secondary | ICD-10-CM | POA: Diagnosis not present

## 2019-08-11 DIAGNOSIS — Z471 Aftercare following joint replacement surgery: Secondary | ICD-10-CM | POA: Diagnosis not present

## 2019-08-11 DIAGNOSIS — I10 Essential (primary) hypertension: Secondary | ICD-10-CM | POA: Diagnosis not present

## 2019-08-11 DIAGNOSIS — Z96652 Presence of left artificial knee joint: Secondary | ICD-10-CM | POA: Diagnosis not present

## 2019-08-13 DIAGNOSIS — I10 Essential (primary) hypertension: Secondary | ICD-10-CM | POA: Diagnosis not present

## 2019-08-13 DIAGNOSIS — Z471 Aftercare following joint replacement surgery: Secondary | ICD-10-CM | POA: Diagnosis not present

## 2019-08-13 DIAGNOSIS — Z96651 Presence of right artificial knee joint: Secondary | ICD-10-CM | POA: Diagnosis not present

## 2019-08-13 DIAGNOSIS — Z6841 Body Mass Index (BMI) 40.0 and over, adult: Secondary | ICD-10-CM | POA: Diagnosis not present

## 2019-08-13 DIAGNOSIS — Z96652 Presence of left artificial knee joint: Secondary | ICD-10-CM | POA: Diagnosis not present

## 2019-08-15 DIAGNOSIS — Z96651 Presence of right artificial knee joint: Secondary | ICD-10-CM | POA: Diagnosis not present

## 2019-08-15 DIAGNOSIS — Z6841 Body Mass Index (BMI) 40.0 and over, adult: Secondary | ICD-10-CM | POA: Diagnosis not present

## 2019-08-15 DIAGNOSIS — Z471 Aftercare following joint replacement surgery: Secondary | ICD-10-CM | POA: Diagnosis not present

## 2019-08-15 DIAGNOSIS — I10 Essential (primary) hypertension: Secondary | ICD-10-CM | POA: Diagnosis not present

## 2019-08-15 DIAGNOSIS — Z96652 Presence of left artificial knee joint: Secondary | ICD-10-CM | POA: Diagnosis not present

## 2019-08-19 DIAGNOSIS — Z471 Aftercare following joint replacement surgery: Secondary | ICD-10-CM | POA: Diagnosis not present

## 2019-08-19 DIAGNOSIS — Z6841 Body Mass Index (BMI) 40.0 and over, adult: Secondary | ICD-10-CM | POA: Diagnosis not present

## 2019-08-19 DIAGNOSIS — I10 Essential (primary) hypertension: Secondary | ICD-10-CM | POA: Diagnosis not present

## 2019-08-19 DIAGNOSIS — Z96651 Presence of right artificial knee joint: Secondary | ICD-10-CM | POA: Diagnosis not present

## 2019-08-19 DIAGNOSIS — Z96652 Presence of left artificial knee joint: Secondary | ICD-10-CM | POA: Diagnosis not present

## 2019-08-21 DIAGNOSIS — Z96651 Presence of right artificial knee joint: Secondary | ICD-10-CM | POA: Diagnosis not present

## 2019-08-21 DIAGNOSIS — Z471 Aftercare following joint replacement surgery: Secondary | ICD-10-CM | POA: Diagnosis not present

## 2019-08-21 DIAGNOSIS — Z96652 Presence of left artificial knee joint: Secondary | ICD-10-CM | POA: Diagnosis not present

## 2019-08-21 DIAGNOSIS — Z6841 Body Mass Index (BMI) 40.0 and over, adult: Secondary | ICD-10-CM | POA: Diagnosis not present

## 2019-08-21 DIAGNOSIS — I10 Essential (primary) hypertension: Secondary | ICD-10-CM | POA: Diagnosis not present

## 2019-09-02 DIAGNOSIS — M25561 Pain in right knee: Secondary | ICD-10-CM | POA: Diagnosis not present

## 2019-09-02 DIAGNOSIS — Z96651 Presence of right artificial knee joint: Secondary | ICD-10-CM | POA: Diagnosis not present

## 2019-09-15 DIAGNOSIS — E039 Hypothyroidism, unspecified: Secondary | ICD-10-CM | POA: Diagnosis not present

## 2019-09-15 DIAGNOSIS — I10 Essential (primary) hypertension: Secondary | ICD-10-CM | POA: Diagnosis not present

## 2019-09-15 DIAGNOSIS — R739 Hyperglycemia, unspecified: Secondary | ICD-10-CM | POA: Diagnosis not present

## 2019-09-15 DIAGNOSIS — E782 Mixed hyperlipidemia: Secondary | ICD-10-CM | POA: Diagnosis not present

## 2019-09-15 DIAGNOSIS — K219 Gastro-esophageal reflux disease without esophagitis: Secondary | ICD-10-CM | POA: Diagnosis not present

## 2019-09-16 DIAGNOSIS — M1711 Unilateral primary osteoarthritis, right knee: Secondary | ICD-10-CM | POA: Diagnosis not present

## 2019-09-18 DIAGNOSIS — R739 Hyperglycemia, unspecified: Secondary | ICD-10-CM | POA: Diagnosis not present

## 2019-09-18 DIAGNOSIS — Z0001 Encounter for general adult medical examination with abnormal findings: Secondary | ICD-10-CM | POA: Diagnosis not present

## 2019-09-18 DIAGNOSIS — E782 Mixed hyperlipidemia: Secondary | ICD-10-CM | POA: Diagnosis not present

## 2019-09-18 DIAGNOSIS — R609 Edema, unspecified: Secondary | ICD-10-CM | POA: Diagnosis not present

## 2019-09-18 DIAGNOSIS — Z23 Encounter for immunization: Secondary | ICD-10-CM | POA: Diagnosis not present

## 2019-09-18 DIAGNOSIS — I10 Essential (primary) hypertension: Secondary | ICD-10-CM | POA: Diagnosis not present

## 2019-09-18 DIAGNOSIS — Z6841 Body Mass Index (BMI) 40.0 and over, adult: Secondary | ICD-10-CM | POA: Diagnosis not present

## 2019-09-18 DIAGNOSIS — E039 Hypothyroidism, unspecified: Secondary | ICD-10-CM | POA: Diagnosis not present

## 2019-09-19 DIAGNOSIS — M25661 Stiffness of right knee, not elsewhere classified: Secondary | ICD-10-CM | POA: Diagnosis not present

## 2019-09-24 DIAGNOSIS — R062 Wheezing: Secondary | ICD-10-CM | POA: Diagnosis not present

## 2019-09-24 DIAGNOSIS — G2581 Restless legs syndrome: Secondary | ICD-10-CM | POA: Diagnosis not present

## 2019-09-24 DIAGNOSIS — G4733 Obstructive sleep apnea (adult) (pediatric): Secondary | ICD-10-CM | POA: Diagnosis not present

## 2019-09-24 DIAGNOSIS — G4752 REM sleep behavior disorder: Secondary | ICD-10-CM | POA: Diagnosis not present

## 2019-09-25 DIAGNOSIS — M25661 Stiffness of right knee, not elsewhere classified: Secondary | ICD-10-CM | POA: Diagnosis not present

## 2019-09-26 DIAGNOSIS — M25661 Stiffness of right knee, not elsewhere classified: Secondary | ICD-10-CM | POA: Diagnosis not present

## 2019-10-01 DIAGNOSIS — M25661 Stiffness of right knee, not elsewhere classified: Secondary | ICD-10-CM | POA: Diagnosis not present

## 2019-10-03 DIAGNOSIS — M25661 Stiffness of right knee, not elsewhere classified: Secondary | ICD-10-CM | POA: Diagnosis not present

## 2019-10-06 DIAGNOSIS — M25661 Stiffness of right knee, not elsewhere classified: Secondary | ICD-10-CM | POA: Diagnosis not present

## 2019-10-09 DIAGNOSIS — M25661 Stiffness of right knee, not elsewhere classified: Secondary | ICD-10-CM | POA: Diagnosis not present

## 2019-10-12 DIAGNOSIS — S80211A Abrasion, right knee, initial encounter: Secondary | ICD-10-CM | POA: Diagnosis not present

## 2019-10-12 DIAGNOSIS — Z23 Encounter for immunization: Secondary | ICD-10-CM | POA: Diagnosis not present

## 2019-10-15 DIAGNOSIS — M25661 Stiffness of right knee, not elsewhere classified: Secondary | ICD-10-CM | POA: Diagnosis not present

## 2019-10-17 DIAGNOSIS — M25661 Stiffness of right knee, not elsewhere classified: Secondary | ICD-10-CM | POA: Diagnosis not present

## 2019-10-20 DIAGNOSIS — M25661 Stiffness of right knee, not elsewhere classified: Secondary | ICD-10-CM | POA: Diagnosis not present

## 2019-10-22 DIAGNOSIS — M25661 Stiffness of right knee, not elsewhere classified: Secondary | ICD-10-CM | POA: Diagnosis not present

## 2019-10-27 DIAGNOSIS — M25661 Stiffness of right knee, not elsewhere classified: Secondary | ICD-10-CM | POA: Diagnosis not present

## 2019-10-30 DIAGNOSIS — M25661 Stiffness of right knee, not elsewhere classified: Secondary | ICD-10-CM | POA: Diagnosis not present

## 2019-11-03 DIAGNOSIS — G4733 Obstructive sleep apnea (adult) (pediatric): Secondary | ICD-10-CM | POA: Diagnosis not present

## 2019-11-03 DIAGNOSIS — G4752 REM sleep behavior disorder: Secondary | ICD-10-CM | POA: Diagnosis not present

## 2019-11-03 DIAGNOSIS — G2581 Restless legs syndrome: Secondary | ICD-10-CM | POA: Diagnosis not present

## 2019-11-06 DIAGNOSIS — M25661 Stiffness of right knee, not elsewhere classified: Secondary | ICD-10-CM | POA: Diagnosis not present

## 2019-11-07 DIAGNOSIS — M25661 Stiffness of right knee, not elsewhere classified: Secondary | ICD-10-CM | POA: Diagnosis not present

## 2019-11-10 DIAGNOSIS — M25661 Stiffness of right knee, not elsewhere classified: Secondary | ICD-10-CM | POA: Diagnosis not present

## 2019-11-12 DIAGNOSIS — M25661 Stiffness of right knee, not elsewhere classified: Secondary | ICD-10-CM | POA: Diagnosis not present

## 2019-11-17 DIAGNOSIS — K219 Gastro-esophageal reflux disease without esophagitis: Secondary | ICD-10-CM | POA: Diagnosis not present

## 2019-11-17 DIAGNOSIS — M25661 Stiffness of right knee, not elsewhere classified: Secondary | ICD-10-CM | POA: Diagnosis not present

## 2019-11-17 DIAGNOSIS — I10 Essential (primary) hypertension: Secondary | ICD-10-CM | POA: Diagnosis not present

## 2019-11-17 DIAGNOSIS — E039 Hypothyroidism, unspecified: Secondary | ICD-10-CM | POA: Diagnosis not present

## 2019-11-21 DIAGNOSIS — M25661 Stiffness of right knee, not elsewhere classified: Secondary | ICD-10-CM | POA: Diagnosis not present

## 2019-12-18 DIAGNOSIS — I10 Essential (primary) hypertension: Secondary | ICD-10-CM | POA: Diagnosis not present

## 2019-12-18 DIAGNOSIS — K219 Gastro-esophageal reflux disease without esophagitis: Secondary | ICD-10-CM | POA: Diagnosis not present

## 2019-12-29 DIAGNOSIS — M25661 Stiffness of right knee, not elsewhere classified: Secondary | ICD-10-CM | POA: Diagnosis not present

## 2019-12-31 DIAGNOSIS — M25661 Stiffness of right knee, not elsewhere classified: Secondary | ICD-10-CM | POA: Diagnosis not present

## 2020-01-07 DIAGNOSIS — M25661 Stiffness of right knee, not elsewhere classified: Secondary | ICD-10-CM | POA: Diagnosis not present

## 2020-01-09 DIAGNOSIS — M25661 Stiffness of right knee, not elsewhere classified: Secondary | ICD-10-CM | POA: Diagnosis not present

## 2020-01-14 DIAGNOSIS — M25661 Stiffness of right knee, not elsewhere classified: Secondary | ICD-10-CM | POA: Diagnosis not present

## 2020-01-16 DIAGNOSIS — K219 Gastro-esophageal reflux disease without esophagitis: Secondary | ICD-10-CM | POA: Diagnosis not present

## 2020-01-16 DIAGNOSIS — I10 Essential (primary) hypertension: Secondary | ICD-10-CM | POA: Diagnosis not present

## 2020-01-16 DIAGNOSIS — M25661 Stiffness of right knee, not elsewhere classified: Secondary | ICD-10-CM | POA: Diagnosis not present

## 2020-01-21 DIAGNOSIS — M25661 Stiffness of right knee, not elsewhere classified: Secondary | ICD-10-CM | POA: Diagnosis not present

## 2020-01-23 DIAGNOSIS — M25661 Stiffness of right knee, not elsewhere classified: Secondary | ICD-10-CM | POA: Diagnosis not present

## 2020-01-27 DIAGNOSIS — H02825 Cysts of left lower eyelid: Secondary | ICD-10-CM | POA: Diagnosis not present

## 2020-02-12 DIAGNOSIS — I10 Essential (primary) hypertension: Secondary | ICD-10-CM | POA: Diagnosis not present

## 2020-02-12 DIAGNOSIS — E039 Hypothyroidism, unspecified: Secondary | ICD-10-CM | POA: Diagnosis not present

## 2020-02-13 DIAGNOSIS — I10 Essential (primary) hypertension: Secondary | ICD-10-CM | POA: Diagnosis not present

## 2020-02-13 DIAGNOSIS — E039 Hypothyroidism, unspecified: Secondary | ICD-10-CM | POA: Diagnosis not present

## 2020-02-17 DIAGNOSIS — G4752 REM sleep behavior disorder: Secondary | ICD-10-CM | POA: Diagnosis not present

## 2020-02-17 DIAGNOSIS — G2581 Restless legs syndrome: Secondary | ICD-10-CM | POA: Diagnosis not present

## 2020-02-19 DIAGNOSIS — E039 Hypothyroidism, unspecified: Secondary | ICD-10-CM | POA: Diagnosis not present

## 2020-02-19 DIAGNOSIS — I10 Essential (primary) hypertension: Secondary | ICD-10-CM | POA: Diagnosis not present

## 2020-02-19 DIAGNOSIS — M1711 Unilateral primary osteoarthritis, right knee: Secondary | ICD-10-CM | POA: Diagnosis not present

## 2020-02-19 DIAGNOSIS — G2581 Restless legs syndrome: Secondary | ICD-10-CM | POA: Diagnosis not present

## 2020-02-19 DIAGNOSIS — K219 Gastro-esophageal reflux disease without esophagitis: Secondary | ICD-10-CM | POA: Diagnosis not present

## 2020-02-19 DIAGNOSIS — E782 Mixed hyperlipidemia: Secondary | ICD-10-CM | POA: Diagnosis not present

## 2020-02-19 DIAGNOSIS — R739 Hyperglycemia, unspecified: Secondary | ICD-10-CM | POA: Diagnosis not present

## 2020-02-23 DIAGNOSIS — E782 Mixed hyperlipidemia: Secondary | ICD-10-CM | POA: Diagnosis not present

## 2020-02-23 DIAGNOSIS — K219 Gastro-esophageal reflux disease without esophagitis: Secondary | ICD-10-CM | POA: Diagnosis not present

## 2020-02-23 DIAGNOSIS — R739 Hyperglycemia, unspecified: Secondary | ICD-10-CM | POA: Diagnosis not present

## 2020-02-23 DIAGNOSIS — E039 Hypothyroidism, unspecified: Secondary | ICD-10-CM | POA: Diagnosis not present

## 2020-02-23 DIAGNOSIS — R609 Edema, unspecified: Secondary | ICD-10-CM | POA: Diagnosis not present

## 2020-02-23 DIAGNOSIS — I10 Essential (primary) hypertension: Secondary | ICD-10-CM | POA: Diagnosis not present

## 2020-02-23 DIAGNOSIS — G2581 Restless legs syndrome: Secondary | ICD-10-CM | POA: Diagnosis not present

## 2020-03-16 DIAGNOSIS — E039 Hypothyroidism, unspecified: Secondary | ICD-10-CM | POA: Diagnosis not present

## 2020-03-16 DIAGNOSIS — I10 Essential (primary) hypertension: Secondary | ICD-10-CM | POA: Diagnosis not present

## 2020-03-17 DIAGNOSIS — E039 Hypothyroidism, unspecified: Secondary | ICD-10-CM | POA: Diagnosis not present

## 2020-03-17 DIAGNOSIS — E782 Mixed hyperlipidemia: Secondary | ICD-10-CM | POA: Diagnosis not present

## 2020-03-17 DIAGNOSIS — I10 Essential (primary) hypertension: Secondary | ICD-10-CM | POA: Diagnosis not present

## 2020-03-18 DIAGNOSIS — Z1289 Encounter for screening for malignant neoplasm of other sites: Secondary | ICD-10-CM | POA: Diagnosis not present

## 2020-03-18 DIAGNOSIS — Z1212 Encounter for screening for malignant neoplasm of rectum: Secondary | ICD-10-CM | POA: Diagnosis not present

## 2020-03-18 DIAGNOSIS — N8111 Cystocele, midline: Secondary | ICD-10-CM | POA: Diagnosis not present

## 2020-03-18 DIAGNOSIS — N952 Postmenopausal atrophic vaginitis: Secondary | ICD-10-CM | POA: Diagnosis not present

## 2020-03-29 DIAGNOSIS — Z1231 Encounter for screening mammogram for malignant neoplasm of breast: Secondary | ICD-10-CM | POA: Diagnosis not present

## 2020-04-14 ENCOUNTER — Telehealth: Payer: Self-pay | Admitting: Radiology

## 2020-04-14 ENCOUNTER — Ambulatory Visit (INDEPENDENT_AMBULATORY_CARE_PROVIDER_SITE_OTHER): Payer: Medicare Other | Admitting: Orthopedic Surgery

## 2020-04-14 ENCOUNTER — Other Ambulatory Visit: Payer: Self-pay

## 2020-04-14 ENCOUNTER — Telehealth: Payer: Self-pay | Admitting: Orthopedic Surgery

## 2020-04-14 ENCOUNTER — Encounter: Payer: Self-pay | Admitting: Orthopedic Surgery

## 2020-04-14 VITALS — BP 144/81 | HR 93 | Ht 61.0 in | Wt 237.0 lb

## 2020-04-14 DIAGNOSIS — G8929 Other chronic pain: Secondary | ICD-10-CM

## 2020-04-14 DIAGNOSIS — M47816 Spondylosis without myelopathy or radiculopathy, lumbar region: Secondary | ICD-10-CM

## 2020-04-14 DIAGNOSIS — M5441 Lumbago with sciatica, right side: Secondary | ICD-10-CM

## 2020-04-14 DIAGNOSIS — Z6841 Body Mass Index (BMI) 40.0 and over, adult: Secondary | ICD-10-CM

## 2020-04-14 MED ORDER — PREDNISONE 10 MG (48) PO TBPK: ORAL_TABLET | ORAL | 0 refills | Status: DC

## 2020-04-14 MED ORDER — PREDNISONE 10 MG (48) PO TBPK: ORAL_TABLET | Freq: Every day | ORAL | 0 refills | Status: DC

## 2020-04-14 NOTE — Telephone Encounter (Signed)
Yes, that is fine I gave her central scheduling phone number to call and I can edit the facility if needed.

## 2020-04-14 NOTE — Patient Instructions (Addendum)
MRI WILL BE SCHEDULED   START NEW PRESCRIPTION    Herniated Disk  A herniated disk, also called a ruptured disk or slipped disk, occurs when a disk in the spine bulges out too far. Between the bones in the spine (vertebrae), there are oval disks that are made of a soft, spongy center filled with liquid that is surrounded by a tough outer ring. The disks connect the vertebrae, help the spine move, and keep the bones from rubbing against each other when you move. When you have a herniated disk, the spongy center of the disk bulges out or breaks through the outer ring. The spongy center can press on a nerve between the vertebrae and cause pain. This can occur anywhere in the back or neck area, but the lower back is most commonly affected. What are the causes? This condition may be caused by:  Age-related wear and tear. The spongy centers of spinal disks tend to shrink and dry out with age, which makes them more likely to herniate.  Sudden injury, such as a strain or sprain. What increases the risk? The following factors may make you more likely to develop this condition:  Aging. This is the main risk factor for a herniated disk.  Being a man who is 16-55 years old.  Frequently doing activities that involve heavy lifting, bending, or twisting.  Not getting enough exercise.  Being overweight.  Using tobacco products. What are the signs or symptoms? Symptoms may vary depending on where your herniated disk is located.  A herniated disk in the lower back may cause sharp pain in: ? Part of the arm, leg, hip, or buttocks. ? The back of the lower leg (calf). ? The lower back, spreading down through the leg into the foot (sciatica).  A herniated disk in the neck may cause dizziness and vertigo. It may also cause pain or weakness in: ? The neck. ? The shoulder blades. ? The upper arm, forearm, or fingers. You may also have muscle weakness. It may be difficult to:  Lift your leg or  arm.  Stand on your toes.  Squeeze tightly with one of your hands. Other symptoms may include:  Numbness or tingling in the affected areas of the hands, arms, feet, or legs.  Inability to control when you urinate or when you have bowel movements. This is a rare but serious sign of a severe herniated disk in the lower back. How is this diagnosed? This condition may be diagnosed based on:  Your symptoms.  Your medical history.  A physical exam. The exam may include: ? A straight-leg test. For this test, you will lie on your back while your health care provider lifts your leg, keeping your knee straight. If you feel pain, you likely have a herniated disk. ? Neurologic tests. These include checking for numbness, reflexes, muscle strength, and problems with posture.  Imaging tests, such as: ? X-rays. ? MRI. ? CT scan. ? Electromyogram (EMG) to check the nerves that control muscles. This test may be used to determine which nerves are affected by your herniated disk. How is this treated? Treatment for this condition may include:  A short period of rest. This is usually the first treatment. ? You may be on bed rest for up to 2 days, or you may be instructed to stay home and avoid physical activity. ? If you have a herniated disk in your lower back, avoid sitting as much as possible. Sitting increases pressure on the disk.  Medicines. These may include: ? NSAIDs, such as ibuprofen, to help reduce pain and swelling. ? Muscle relaxants to prevent sudden tightening of the back muscles (back spasms). ? Prescription pain medicines, if you have severe pain.  Ice or heat therapy.  Steroid injections in the area of the herniated disk. These can help reduce pain and swelling.  Physical therapy to strengthen your back muscles. In many cases, symptoms go away with treatment over a period of days or weeks. You will most likely be free of symptoms after 3-4 months. If other treatments do not  help to relieve your symptoms, you may need surgery. Follow these instructions at home: Medicines  Take over-the-counter and prescription medicines only as told by your health care provider.  Ask your health care provider if the medicine prescribed to you: ? Requires you to avoid driving or using heavy machinery. ? Can cause constipation. You may need to take these actions to prevent or treat constipation:  Drink enough fluid to keep your urine pale yellow.  Take over-the-counter or prescription medicines.  Eat foods that are high in fiber, such as beans, whole grains, and fresh fruits and vegetables.  Limit foods that are high in fat and processed sugars, such as fried or sweet foods. Managing pain, stiffness, and swelling      If directed, put ice on the painful area. Icing can help to relieve pain. To do this: ? Put ice in a plastic bag. ? Place a towel between your skin and the bag. ? Leave the ice on for 20 minutes, 2-3 times a day.  If directed, apply heat to the painful area as often as told by your health care provider. Heat can reduce the stiffness of your muscles. Use the heat source that your health care provider recommends, such as a moist heat pack or a heating pad. ? Place a towel between your skin and the heat source. ? Leave the heat on for 20-30 minutes. ? Remove the heat if your skin turns bright red. This is especially important if you are unable to feel pain, heat, or cold. You may have a greater risk of getting burned. Activity  Rest as told by your health care provider.  After your rest period: ? Return to your normal activities and gradually begin exercising as told by your health care provider. Ask your health care provider what activities and exercises are safe for you. ? Use good posture. ? Avoid movements that cause pain. ? Do not lift anything that is heavier than 10 lb (4.5 kg), or the limit that you are told, until your health care provider says  that it is safe. ? Do not sit or stand for long periods of time without changing positions. ? Do not sit for long periods of time without getting up and moving around.  If physical therapy was prescribed, do exercises as told by your health care provider.  Aim to strengthen muscles in your back and abdomen with exercises such as swimming or walking. General instructions  Do not use any products that contain nicotine or tobacco, such as cigarettes, e-cigarettes, and chewing tobacco. These products can delay healing. If you need help quitting, ask your health care provider.  Do not wear high-heeled shoes.  Do not sleep on your abdomen.  If you are overweight, work with your health care provider to lose weight safely.  Keep all follow-up visits as told by your health care provider. This is important. How is this prevented?  Maintain a healthy weight.  Maintain physical fitness. Do at least 150 minutes of moderate-intensity exercise each week, such as brisk walking or water aerobics.  When lifting objects: ? Keep your feet at least shoulder-width apart and tighten the muscles of your abdomen. ? Keep your spine neutral as you bend your knees and hips. It is important to lift using the strength of your legs, not your back. Do not lock your knees straight out. ? Always ask for help to lift heavy or awkward objects. Contact a health care provider if you:  Have back pain or neck pain that does not get better after 6 weeks.  Have severe pain in your back, neck, legs, or arms.  Develop numbness, tingling, or weakness anywhere in your body. Get help right away if:  You cannot move your arms or legs.  You cannot control when you urinate or have bowel movements.  You feel dizzy or you faint.  You have shortness of breath. Summary  A herniated disk, also called a ruptured disk or slipped disk, occurs when a disk in the spine bulges out too far (herniates).  This condition may be  caused by age-related wear and tear or a sudden injury.  Symptoms may vary depending on where your herniated disk is located.  Treatment may include rest, medicines, ice or heat therapy, steroid injections, and physical therapy.  If other treatments do not help to relieve your symptoms, you may need surgery. This information is not intended to replace advice given to you by your health care provider. Make sure you discuss any questions you have with your health care provider. Document Revised: 07/08/2019 Document Reviewed: 07/08/2019 Elsevier Patient Education  [place].

## 2020-04-14 NOTE — Telephone Encounter (Signed)
[employer] called; Dr Awilda Metro [phone] - has question regarding instructions - medication: predniSONE (STERAPRED UNI-PAK 48 TAB) 10 MG (48) TBPK tablet 48 tablet  - asking if Dr Aline Brochure requests the standard with taper; said also it came across as 48 days. Please call.

## 2020-04-14 NOTE — Telephone Encounter (Signed)
Changed the sig and resent Thank you

## 2020-04-14 NOTE — Telephone Encounter (Signed)
-----   Message from Uvaldo Bristle sent at 04/14/2020  3:19 PM EDT ----- Regarding: MRI appt question Sherri Sandoval,  Patient Sherri Sandoval [id number] called to relay that her MRI at [facility] is not until 05/11/20;  Asking if she may be rescheduled in [place] or sooner at [facility] (?is there an MRI delay at [facility]?)  Thank you

## 2020-04-14 NOTE — Progress Notes (Signed)
Chief Complaint  Patient presents with  . Back Pain    left leg pain/ bad day yesterday has gotten worse numbness in left great toe     Jul 28 2019 RT [facility]   Sherri Sandoval comes in today with acute pain in her lower back she bent over felt a pop and then severe left leg pain radiating down to the left foot with numbness in her great toe  She has a x-ray which shows degenerative disc disease lumbar spine  She denies any weakness bowel bladder dysfunction  Past Medical History:  Diagnosis Date  . Arthritis   . Depression   . GERD (gastroesophageal reflux disease)   . Hypertension   . Osteoporosis   . Thyroid disease    Past Surgical History:  Procedure Laterality Date  . BREAST BIOPSY Left   . CARPAL TUNNEL RELEASE Left   . KNEE SURGERY Bilateral   . KNEE SURGERY Right 07/2019   RT TKA   . PARTIAL HYSTERECTOMY  1975     BP (!) 144/81   Pulse 93   Ht 5\' 1"  (1.549 m)   Wt 237 lb (107.5 kg)   BMI 44.78 kg/m   She has tenderness in the lower back her right straight leg raise reproduces lower back pain on the right left was normal  Sensory exam was normal bilaterally  Reflexes were 0+ at the knee and ankle  Note status post bilateral total knees  No weakness was noted in the lower extremities  She could walk without support and there was no limp  Encounter Diagnoses  Name Primary?  . Chronic midline low back pain with right-sided sciatica Yes  . Lumbar spondylosis   . Body mass index 40.0-44.9, adult ([place])   . Morbid obesity ([place])    I gave her a prednisone Dosepak and we can order an MRI of her back to see if epidurals injections can help  Meds ordered this encounter  Medications  . predniSONE (STERAPRED UNI-PAK 48 TAB) 10 MG (48) TBPK tablet    Sig: Take by mouth daily.    Dispense:  48 tablet    Refill:  0

## 2020-04-15 DIAGNOSIS — E7849 Other hyperlipidemia: Secondary | ICD-10-CM | POA: Diagnosis not present

## 2020-04-15 DIAGNOSIS — I10 Essential (primary) hypertension: Secondary | ICD-10-CM | POA: Diagnosis not present

## 2020-04-16 DIAGNOSIS — E7849 Other hyperlipidemia: Secondary | ICD-10-CM | POA: Diagnosis not present

## 2020-04-16 DIAGNOSIS — I10 Essential (primary) hypertension: Secondary | ICD-10-CM | POA: Diagnosis not present

## 2020-04-24 ENCOUNTER — Ambulatory Visit (HOSPITAL_BASED_OUTPATIENT_CLINIC_OR_DEPARTMENT_OTHER)
Admission: RE | Admit: 2020-04-24 | Discharge: 2020-04-24 | Disposition: A | Discharge status: Home / Self Care | Payer: Medicare Other | Source: Ambulatory Visit | Attending: Orthopedic Surgery | Admitting: Orthopedic Surgery

## 2020-04-24 ENCOUNTER — Other Ambulatory Visit: Payer: Self-pay

## 2020-04-24 DIAGNOSIS — M47816 Spondylosis without myelopathy or radiculopathy, lumbar region: Secondary | ICD-10-CM | POA: Insufficient documentation

## 2020-04-24 DIAGNOSIS — M545 Low back pain: Secondary | ICD-10-CM | POA: Diagnosis not present

## 2020-04-24 DIAGNOSIS — M5441 Lumbago with sciatica, right side: Secondary | ICD-10-CM | POA: Insufficient documentation

## 2020-04-24 DIAGNOSIS — G8929 Other chronic pain: Secondary | ICD-10-CM | POA: Insufficient documentation

## 2020-04-26 ENCOUNTER — Telehealth: Payer: Self-pay

## 2020-04-26 ENCOUNTER — Other Ambulatory Visit: Payer: Self-pay | Admitting: Orthopedic Surgery

## 2020-04-26 MED ORDER — IBUPROFEN 800 MG PO TABS: 800.0000 mg | ORAL_TABLET | Freq: Three times a day (TID) | ORAL | 1 refills | Status: DC | PRN

## 2020-04-26 NOTE — Progress Notes (Signed)
Meds ordered this encounter  Medications   ibuprofen (ADVIL) 800 MG tablet    Sig: Take 1 tablet (800 mg total) by mouth every 8 (eight) hours as needed.    Dispense:  90 tablet    Refill:  1    

## 2020-04-26 NOTE — Telephone Encounter (Signed)
Patient called stating she is in a lot of pain and wanted to see if Dr. Aline Brochure would send her something in for pain.  PATIENT USES [facility] IN [place], [place]

## 2020-04-27 ENCOUNTER — Other Ambulatory Visit: Payer: Self-pay

## 2020-04-27 ENCOUNTER — Ambulatory Visit (INDEPENDENT_AMBULATORY_CARE_PROVIDER_SITE_OTHER): Payer: Medicare Other | Admitting: Orthopedic Surgery

## 2020-04-27 ENCOUNTER — Encounter: Payer: Self-pay | Admitting: Orthopedic Surgery

## 2020-04-27 VITALS — BP 127/68 | Ht 61.0 in | Wt 243.0 lb

## 2020-04-27 DIAGNOSIS — E785 Hyperlipidemia, unspecified: Secondary | ICD-10-CM | POA: Insufficient documentation

## 2020-04-27 DIAGNOSIS — M48062 Spinal stenosis, lumbar region with neurogenic claudication: Secondary | ICD-10-CM

## 2020-04-27 DIAGNOSIS — G4730 Sleep apnea, unspecified: Secondary | ICD-10-CM | POA: Insufficient documentation

## 2020-04-27 DIAGNOSIS — I10 Essential (primary) hypertension: Secondary | ICD-10-CM | POA: Insufficient documentation

## 2020-04-27 DIAGNOSIS — E039 Hypothyroidism, unspecified: Secondary | ICD-10-CM | POA: Insufficient documentation

## 2020-04-27 MED ORDER — TRAMADOL-ACETAMINOPHEN 37.5-325 MG PO TABS: 1.0000 | ORAL_TABLET | ORAL | 5 refills | Status: DC | PRN

## 2020-04-27 MED ORDER — TRAMADOL-ACETAMINOPHEN 37.5-325 MG PO TABS: 1.0000 | ORAL_TABLET | Freq: Four times a day (QID) | ORAL | 0 refills | Status: AC | PRN

## 2020-04-27 NOTE — Patient Instructions (Signed)
Referral to neurosurgery   Continue ibuprofen and start ultracet

## 2020-04-27 NOTE — Progress Notes (Signed)
Chief Complaint  Patient presents with  . Back Pain  . Results    review MRI    Sherri Sandoval is continued to have significant increase in pain in her lower back.  She says is very hard for her to sit  Denies giving way of the legs or any red flag symptoms  She did start some ibuprofen after prednisone taper.  She says the ibuprofen helps some but she still having a fair amount of pain  Her MRI was reviewed and independently read: She has multiple levels of spinal stenosis  Her MRI report is below L3-L4: Mild disc narrowing and bulging with degenerative facet spurring. Moderate spinal stenosis which is accentuated by epidural fat. No foraminal impingement   L4-L5: Disc narrowing and bulging with posterior element hypertrophy. Spinal stenosis is advanced. Noncompressive bilateral foraminal narrowing   L5-S1:Advanced facet osteoarthritis with spurring and slight anterolisthesis. No impingement.   IMPRESSION: 1. Generalized heterogeneity of marrow, please correlate with hematologic labs. 2. Degenerative spinal stenosis that is advanced at L4-5 and moderate at L3-4. 3. 2.5 cm right ovarian cyst with simple T2 appearance where covered.     Electronically Signed   By: Monte Fantasia M.D.   On: 04/25/2020 19:40  Meds ordered this encounter  Medications  . DISCONTD: traMADol-acetaminophen (ULTRACET) 37.5-325 MG tablet    Sig: Take 1 tablet by mouth every 4 (four) hours as needed.    Dispense:  90 tablet    Refill:  5  . traMADol-acetaminophen (ULTRACET) 37.5-325 MG tablet    Sig: Take 1 tablet by mouth every 6 (six) hours as needed for up to 5 days.    Dispense:  20 tablet    Refill:  0

## 2020-04-30 DIAGNOSIS — M48061 Spinal stenosis, lumbar region without neurogenic claudication: Secondary | ICD-10-CM | POA: Diagnosis not present

## 2020-04-30 DIAGNOSIS — M4316 Spondylolisthesis, lumbar region: Secondary | ICD-10-CM | POA: Diagnosis not present

## 2020-05-11 ENCOUNTER — Ambulatory Visit (HOSPITAL_COMMUNITY): Payer: Medicare Other

## 2020-05-12 ENCOUNTER — Other Ambulatory Visit: Payer: Self-pay | Admitting: Neurosurgery

## 2020-05-12 DIAGNOSIS — M4316 Spondylolisthesis, lumbar region: Secondary | ICD-10-CM

## 2020-05-17 DIAGNOSIS — E7849 Other hyperlipidemia: Secondary | ICD-10-CM | POA: Diagnosis not present

## 2020-05-17 DIAGNOSIS — I10 Essential (primary) hypertension: Secondary | ICD-10-CM | POA: Diagnosis not present

## 2020-05-17 DIAGNOSIS — E039 Hypothyroidism, unspecified: Secondary | ICD-10-CM | POA: Diagnosis not present

## 2020-05-17 DIAGNOSIS — K219 Gastro-esophageal reflux disease without esophagitis: Secondary | ICD-10-CM | POA: Diagnosis not present

## 2020-05-19 ENCOUNTER — Other Ambulatory Visit: Payer: Self-pay

## 2020-05-19 ENCOUNTER — Ambulatory Visit (HOSPITAL_COMMUNITY)
Admission: RE | Admit: 2020-05-19 | Discharge: 2020-05-19 | Disposition: A | Discharge status: Home / Self Care | Payer: Medicare Other | Source: Ambulatory Visit | Attending: Neurosurgery | Admitting: Neurosurgery

## 2020-05-19 DIAGNOSIS — M4316 Spondylolisthesis, lumbar region: Secondary | ICD-10-CM | POA: Diagnosis not present

## 2020-05-19 DIAGNOSIS — M85852 Other specified disorders of bone density and structure, left thigh: Secondary | ICD-10-CM | POA: Insufficient documentation

## 2020-05-19 DIAGNOSIS — Z780 Asymptomatic menopausal state: Secondary | ICD-10-CM | POA: Diagnosis not present

## 2020-05-27 DIAGNOSIS — M48061 Spinal stenosis, lumbar region without neurogenic claudication: Secondary | ICD-10-CM | POA: Diagnosis not present

## 2020-06-15 ENCOUNTER — Other Ambulatory Visit: Payer: Self-pay | Admitting: Neurosurgery

## 2020-06-17 DIAGNOSIS — M545 Low back pain: Secondary | ICD-10-CM | POA: Diagnosis not present

## 2020-06-17 DIAGNOSIS — Z6841 Body Mass Index (BMI) 40.0 and over, adult: Secondary | ICD-10-CM | POA: Diagnosis not present

## 2020-06-17 DIAGNOSIS — E039 Hypothyroidism, unspecified: Secondary | ICD-10-CM | POA: Diagnosis not present

## 2020-06-28 NOTE — Pre-Procedure Instructions (Signed)
[place], [place] [address] Phone: [phone] Fax: [phone]  [facility] [place], [place] [address] Phone: [phone] Fax: [phone]    Your procedure is scheduled on Thurs., July 01, 2020  Report to [facility] Entrance "A" at 7:30AM-10:00AM  Call this number if you have problems the morning of surgery:  [phone]   Remember:  Do not eat or drink after midnight on July 14th    Take these medicines the morning of surgery with A SIP OF WATER: Levothyroxine (SYNTHROID, LEVOTHROID) Gabapentin (NEURONTIN)     Omeprazole (PRILOSEC)       If Needed: Albuterol (VENTOLIN HFA)  As of today, STOP taking all Aspirin (unless instructed by your doctor) and Other Aspirin containing products, Vitamins, Fish oils, and Herbal medications. Also stop all NSAIDS i.e. Advil, Ibuprofen, Motrin, Aleve, Anaprox, Naproxen, BC, Goody Powders, and all Supplements.   No Smoking of any kind, Tobacco, or Alcohol products 24 hours prior to your procedure. If you use a Cpap at night, you may bring all equipment for your overnight stay.   Special instructions:  - Preparing For Surgery  Before surgery, you can play an important role. Because skin is not sterile, your skin needs to be as free of germs as possible. You can reduce the number of germs on your skin by washing with CHG (chlorahexidine gluconate) Soap before surgery.  CHG is an antiseptic cleaner which kills germs and bonds with the skin to continue killing germs even after washing.    Please do not use if you have an allergy to CHG or antibacterial soaps. If your skin becomes reddened/irritated stop using the CHG.  Do not shave (including legs and underarms) for at least 48 hours prior to first CHG shower. It is OK to shave your face.  Please follow these instructions  carefully.   1. Shower the NIGHT BEFORE SURGERY and the MORNING OF SURGERY with CHG.   2. If you chose to wash your hair, wash your hair first as usual with your normal shampoo.  3. After you shampoo, rinse your hair and body thoroughly to remove the shampoo.  4. Use CHG as you would any other liquid soap. You can apply CHG directly to the skin and wash gently with a scrungie or a clean washcloth.   5. Apply the CHG Soap to your body ONLY FROM THE NECK DOWN.  Do not use on open wounds or open sores. Avoid contact with your eyes, ears, mouth and genitals (private parts). Wash Face and genitals (private parts)  with your normal soap.  6. Wash thoroughly, paying special attention to the area where your surgery will be performed.  7. Thoroughly rinse your body with warm water from the neck down.  8. DO NOT shower/wash with your normal soap after using and rinsing off the CHG Soap.  9. Pat yourself dry with a CLEAN TOWEL.  10. Wear CLEAN PAJAMAS to bed the night before surgery, wear comfortable clothes the morning of surgery  11. Place CLEAN SHEETS on your bed the night of your first shower and DO NOT SLEEP WITH PETS.   Day of Surgery:           Remember to brush your teeth WITH YOUR REGULAR TOOTHPASTE.  Do not wear jewelry, make-up or nail polish.  Do not wear lotions, powders, or perfumes, or  deodorant.  Do not shave 48 hours prior to surgery.    Do not bring valuables to the hospital.  [facility] is not responsible for any belongings or valuables.  Contacts, dentures or bridgework may not be worn into surgery.    For patients admitted to the hospital, discharge time will be determined by your treatment team.  Patients discharged the day of surgery will not be allowed to drive home, and someone age 32 and over needs to stay with them for 24 hours.   Please wear clean clothes to the hospital/surgery center.    Please read over the following fact sheets that you were  given.

## 2020-06-29 ENCOUNTER — Other Ambulatory Visit: Payer: Self-pay

## 2020-06-29 ENCOUNTER — Encounter (HOSPITAL_COMMUNITY)
Admission: RE | Admit: 2020-06-29 | Discharge: 2020-06-29 | Disposition: A | Discharge status: Home / Self Care | Payer: Medicare Other | Source: Ambulatory Visit | Attending: Neurosurgery | Admitting: Neurosurgery

## 2020-06-29 ENCOUNTER — Other Ambulatory Visit (HOSPITAL_COMMUNITY)
Admission: RE | Admit: 2020-06-29 | Discharge: 2020-06-29 | Disposition: A | Discharge status: Home / Self Care | Payer: Medicare Other | Source: Ambulatory Visit | Attending: Neurosurgery | Admitting: Neurosurgery

## 2020-06-29 ENCOUNTER — Encounter (HOSPITAL_COMMUNITY): Payer: Self-pay

## 2020-06-29 DIAGNOSIS — M5416 Radiculopathy, lumbar region: Secondary | ICD-10-CM | POA: Diagnosis not present

## 2020-06-29 DIAGNOSIS — F419 Anxiety disorder, unspecified: Secondary | ICD-10-CM | POA: Diagnosis not present

## 2020-06-29 DIAGNOSIS — E785 Hyperlipidemia, unspecified: Secondary | ICD-10-CM | POA: Diagnosis not present

## 2020-06-29 DIAGNOSIS — I10 Essential (primary) hypertension: Secondary | ICD-10-CM | POA: Diagnosis not present

## 2020-06-29 DIAGNOSIS — M48061 Spinal stenosis, lumbar region without neurogenic claudication: Secondary | ICD-10-CM | POA: Diagnosis not present

## 2020-06-29 DIAGNOSIS — F329 Major depressive disorder, single episode, unspecified: Secondary | ICD-10-CM | POA: Diagnosis not present

## 2020-06-29 DIAGNOSIS — G2581 Restless legs syndrome: Secondary | ICD-10-CM | POA: Diagnosis not present

## 2020-06-29 DIAGNOSIS — Z20822 Contact with and (suspected) exposure to covid-19: Secondary | ICD-10-CM | POA: Insufficient documentation

## 2020-06-29 DIAGNOSIS — M4316 Spondylolisthesis, lumbar region: Secondary | ICD-10-CM | POA: Diagnosis not present

## 2020-06-29 DIAGNOSIS — Z01818 Encounter for other preprocedural examination: Secondary | ICD-10-CM | POA: Insufficient documentation

## 2020-06-29 DIAGNOSIS — E039 Hypothyroidism, unspecified: Secondary | ICD-10-CM | POA: Diagnosis not present

## 2020-06-29 DIAGNOSIS — Z6841 Body Mass Index (BMI) 40.0 and over, adult: Secondary | ICD-10-CM | POA: Diagnosis not present

## 2020-06-29 HISTORY — DX: Hypothyroidism, unspecified: E03.9

## 2020-06-29 HISTORY — DX: Prediabetes: R73.03

## 2020-06-29 HISTORY — DX: Anxiety disorder, unspecified: F41.9

## 2020-06-29 HISTORY — DX: Family history of other specified conditions: Z84.89

## 2020-06-29 HISTORY — DX: Unspecified asthma, uncomplicated: J45.909

## 2020-06-29 HISTORY — DX: Sleep apnea, unspecified: G47.30

## 2020-06-29 LAB — CBC
HCT: 35.7 % — ABNORMAL LOW (ref 36.0–46.0)
Hemoglobin: 12.3 g/dL (ref 12.0–15.0)
MCH: 31.3 pg (ref 26.0–34.0)
MCHC: 34.5 g/dL (ref 30.0–36.0)
MCV: 90.8 fL (ref 80.0–100.0)
Platelets: 186 10*3/uL (ref 150–400)
RBC: 3.93 MIL/uL (ref 3.87–5.11)
RDW: 12.9 % (ref 11.5–15.5)
WBC: 5.5 10*3/uL (ref 4.0–10.5)
nRBC: 0 % (ref 0.0–0.2)

## 2020-06-29 LAB — BASIC METABOLIC PANEL
Anion gap: 12 (ref 5–15)
BUN: 19 mg/dL (ref 8–23)
CO2: 26 mmol/L (ref 22–32)
Calcium: 10 mg/dL (ref 8.9–10.3)
Chloride: 100 mmol/L (ref 98–111)
Creatinine, Ser: 1.08 mg/dL — ABNORMAL HIGH (ref 0.44–1.00)
GFR calc Af Amer: 59 mL/min — ABNORMAL LOW (ref >60)
GFR calc non Af Amer: 51 mL/min — ABNORMAL LOW (ref >60)
Glucose, Bld: 150 mg/dL — ABNORMAL HIGH (ref 70–99)
Potassium: 3.4 mmol/L — ABNORMAL LOW (ref 3.5–5.1)
Sodium: 138 mmol/L (ref 135–145)

## 2020-06-29 LAB — TYPE AND SCREEN
ABO/RH(D): A POS
Antibody Screen: NEGATIVE

## 2020-06-29 LAB — SARS CORONAVIRUS 2 (TAT 6-24 HRS): SARS Coronavirus 2: NEGATIVE

## 2020-06-29 LAB — HEMOGLOBIN A1C
Hgb A1c MFr Bld: 6.5 % — ABNORMAL HIGH (ref 4.8–5.6)
Mean Plasma Glucose: 139.85 mg/dL

## 2020-06-29 LAB — SURGICAL PCR SCREEN
MRSA, PCR: NEGATIVE
Staphylococcus aureus: NEGATIVE

## 2020-06-29 LAB — GLUCOSE, CAPILLARY: Glucose-Capillary: 167 mg/dL — ABNORMAL HIGH (ref 70–99)

## 2020-06-29 NOTE — Progress Notes (Signed)
PCP - Carma Lair- PA  Cardiologist - Denies  Chest x-ray - Denies  EKG - 06/29/20  Stress Test - Denies  ECHO - Denies  Cardiac Cath - Denies  AICD-na PM-na LOOP-na  Sleep Study - Yes- Positive CPAP - Yes- will bring machine dos  LABS- 06/29/20: CBC, BMP, T/S, PCR, COVID  ASA- Denies  ERAS- No  HA1C- 06/29/20 Fasting Blood Sugar - today, 167 Checks Blood Sugar __0___ times a day- Pt sts her last A1C six months ago went to 6.7, therefore, she will start home bs checks, but no medication has been prescribed as yet.  Anesthesia- Yes- medical clearance on the chart.  Pt denies having chest pain, sob, or fever at this time. All instructions explained to the pt, with a verbal understanding of the material. Pt agrees to go over the instructions while at home for a better understanding. Pt also instructed to self quarantine after being tested for COVID-19. The opportunity to ask questions was provided.   Coronavirus Screening  Have you experienced the following symptoms:  Cough yes/no: No Fever (>100.39F)  yes/no: No Runny nose yes/no: No Sore throat yes/no: No Difficulty breathing/shortness of breath  yes/no: No  Have you or a family member traveled in the last 14 days and where? yes/no: No   If the patient indicates "YES" to the above questions, their PAT will be rescheduled to limit the exposure to others and, the surgeon will be notified. THE PATIENT WILL NEED TO BE ASYMPTOMATIC FOR 14 DAYS.   If the patient is not experiencing any of these symptoms, the PAT nurse will instruct them to NOT bring anyone with them to their appointment since they may have these symptoms or traveled as well.   Please remind your patients and families that hospital visitation restrictions are in effect and the importance of the restrictions.

## 2020-06-30 NOTE — Progress Notes (Signed)
Anesthesia Chart Review:  Clearance on chart from patient's PCP Esaw Dace, PA-C states patient moderate risk for surgery.  Recently underwent right TKA in [place], [place] on 9/97/7414 without complication.  OSA on CPAP.  Morbid obesity BMI 45.  Preop labs reviewed, A1c 6.5.  She has diet-controlled DM2.  No medications at this time.  Labs otherwise unremarkable.  EKG 06/29/2020: Normal sinus rhythm.  Rate 79. Inferior infarct , age undetermined. Cannot rule out Anterior infarct , age undetermined. -  No change from tracing dated 06/17/2020 from PCP office at time of surgical clearance. Copy on chart.   [facility] Short Stay [facility]/Anesthesiology Phone [phone] 06/30/2020 11:59 AM

## 2020-06-30 NOTE — Anesthesia Preprocedure Evaluation (Addendum)
Anesthesia Evaluation  Patient identified by MRN, date of birth, ID band Patient awake    Reviewed: Allergy & Precautions, H&P , NPO status , Patient's Chart, lab work & pertinent test results  Airway Mallampati: II   Neck ROM: full    Dental   Pulmonary asthma , sleep apnea ,    breath sounds clear to auscultation       Cardiovascular hypertension,  Rhythm:regular Rate:Normal     Neuro/Psych PSYCHIATRIC DISORDERS Anxiety Depression    GI/Hepatic GERD  ,  Endo/Other  Hypothyroidism Morbid obesity  Renal/GU      Musculoskeletal  (+) Arthritis ,   Abdominal   Peds  Hematology   Anesthesia Other Findings   Reproductive/Obstetrics                            Anesthesia Physical Anesthesia Plan  ASA: II  Anesthesia Plan: General   Post-op Pain Management:    Induction: Intravenous  PONV Risk Score and Plan: 3 and Ondansetron, Dexamethasone, Midazolam and Treatment may vary due to age or medical condition  Airway Management Planned: Oral ETT  Additional Equipment:   Intra-op Plan:   Post-operative Plan: Extubation in OR  Informed Consent: I have reviewed the patients History and Physical, chart, labs and discussed the procedure including the risks, benefits and alternatives for the proposed anesthesia with the patient or authorized representative who has indicated his/her understanding and acceptance.       Plan Discussed with: CRNA, Anesthesiologist and Surgeon  Anesthesia Plan Comments: (PAT note by Karoline Caldwell, PA-C: Clearance on chart from patient's PCP Esaw Dace, PA-C states patient moderate risk for surgery.  Recently underwent right TKA in [place], [place] on 9/35/7017 without complication.  OSA on CPAP.  Morbid obesity BMI 45.  Preop labs reviewed, A1c 6.5.  She has diet-controlled DM2.  No medications at this time.  Labs otherwise unremarkable.  EKG 06/29/2020:  Normal sinus rhythm.  Rate 79. Inferior infarct , age undetermined. Cannot rule out Anterior infarct , age undetermined. -  No change from tracing dated 06/17/2020 from PCP office at time of surgical clearance. Copy on chart. )       Anesthesia Quick Evaluation

## 2020-07-01 ENCOUNTER — Inpatient Hospital Stay (HOSPITAL_COMMUNITY): Payer: Medicare Other | Admitting: Anesthesiology

## 2020-07-01 ENCOUNTER — Inpatient Hospital Stay (HOSPITAL_COMMUNITY): Payer: Medicare Other

## 2020-07-01 ENCOUNTER — Inpatient Hospital Stay (HOSPITAL_COMMUNITY): Payer: Medicare Other | Admitting: Physician Assistant

## 2020-07-01 ENCOUNTER — Encounter (HOSPITAL_COMMUNITY): Payer: Self-pay

## 2020-07-01 ENCOUNTER — Inpatient Hospital Stay (HOSPITAL_COMMUNITY)
Admission: RE | Admit: 2020-07-01 | Discharge: 2020-07-05 | DRG: 454 | Disposition: A | Discharge status: Skilled Nursing Facility | Payer: Medicare Other | Attending: Neurosurgery | Admitting: Neurosurgery

## 2020-07-01 ENCOUNTER — Encounter (HOSPITAL_COMMUNITY)
Admission: RE | Disposition: A | Discharge status: Skilled Nursing Facility | Payer: Self-pay | Source: Home / Self Care | Attending: Neurosurgery

## 2020-07-01 DIAGNOSIS — I119 Hypertensive heart disease without heart failure: Secondary | ICD-10-CM | POA: Diagnosis not present

## 2020-07-01 DIAGNOSIS — E785 Hyperlipidemia, unspecified: Secondary | ICD-10-CM | POA: Diagnosis not present

## 2020-07-01 DIAGNOSIS — Z20822 Contact with and (suspected) exposure to covid-19: Secondary | ICD-10-CM | POA: Diagnosis not present

## 2020-07-01 DIAGNOSIS — M810 Age-related osteoporosis without current pathological fracture: Secondary | ICD-10-CM | POA: Diagnosis present

## 2020-07-01 DIAGNOSIS — R296 Repeated falls: Secondary | ICD-10-CM | POA: Diagnosis present

## 2020-07-01 DIAGNOSIS — Z79899 Other long term (current) drug therapy: Secondary | ICD-10-CM

## 2020-07-01 DIAGNOSIS — Z6841 Body Mass Index (BMI) 40.0 and over, adult: Secondary | ICD-10-CM | POA: Diagnosis not present

## 2020-07-01 DIAGNOSIS — G2581 Restless legs syndrome: Secondary | ICD-10-CM | POA: Diagnosis not present

## 2020-07-01 DIAGNOSIS — G4730 Sleep apnea, unspecified: Secondary | ICD-10-CM | POA: Diagnosis present

## 2020-07-01 DIAGNOSIS — E039 Hypothyroidism, unspecified: Secondary | ICD-10-CM | POA: Diagnosis present

## 2020-07-01 DIAGNOSIS — M170 Bilateral primary osteoarthritis of knee: Secondary | ICD-10-CM | POA: Diagnosis not present

## 2020-07-01 DIAGNOSIS — Z882 Allergy status to sulfonamides status: Secondary | ICD-10-CM | POA: Diagnosis not present

## 2020-07-01 DIAGNOSIS — M4316 Spondylolisthesis, lumbar region: Secondary | ICD-10-CM | POA: Diagnosis not present

## 2020-07-01 DIAGNOSIS — M5416 Radiculopathy, lumbar region: Secondary | ICD-10-CM | POA: Diagnosis not present

## 2020-07-01 DIAGNOSIS — J45909 Unspecified asthma, uncomplicated: Secondary | ICD-10-CM | POA: Diagnosis present

## 2020-07-01 DIAGNOSIS — Z791 Long term (current) use of non-steroidal anti-inflammatories (NSAID): Secondary | ICD-10-CM

## 2020-07-01 DIAGNOSIS — Z79890 Hormone replacement therapy (postmenopausal): Secondary | ICD-10-CM

## 2020-07-01 DIAGNOSIS — Z419 Encounter for procedure for purposes other than remedying health state, unspecified: Secondary | ICD-10-CM

## 2020-07-01 DIAGNOSIS — M1990 Unspecified osteoarthritis, unspecified site: Secondary | ICD-10-CM | POA: Diagnosis present

## 2020-07-01 DIAGNOSIS — M48061 Spinal stenosis, lumbar region without neurogenic claudication: Principal | ICD-10-CM | POA: Diagnosis present

## 2020-07-01 DIAGNOSIS — Z48811 Encounter for surgical aftercare following surgery on the nervous system: Secondary | ICD-10-CM | POA: Diagnosis not present

## 2020-07-01 DIAGNOSIS — F329 Major depressive disorder, single episode, unspecified: Secondary | ICD-10-CM | POA: Diagnosis not present

## 2020-07-01 DIAGNOSIS — K219 Gastro-esophageal reflux disease without esophagitis: Secondary | ICD-10-CM | POA: Diagnosis present

## 2020-07-01 DIAGNOSIS — M2550 Pain in unspecified joint: Secondary | ICD-10-CM | POA: Diagnosis not present

## 2020-07-01 DIAGNOSIS — Z7401 Bed confinement status: Secondary | ICD-10-CM | POA: Diagnosis not present

## 2020-07-01 DIAGNOSIS — M4326 Fusion of spine, lumbar region: Secondary | ICD-10-CM | POA: Diagnosis not present

## 2020-07-01 DIAGNOSIS — I10 Essential (primary) hypertension: Secondary | ICD-10-CM | POA: Diagnosis not present

## 2020-07-01 DIAGNOSIS — R5381 Other malaise: Secondary | ICD-10-CM | POA: Diagnosis not present

## 2020-07-01 DIAGNOSIS — F339 Major depressive disorder, recurrent, unspecified: Secondary | ICD-10-CM | POA: Diagnosis not present

## 2020-07-01 DIAGNOSIS — F419 Anxiety disorder, unspecified: Secondary | ICD-10-CM | POA: Diagnosis present

## 2020-07-01 DIAGNOSIS — Z981 Arthrodesis status: Secondary | ICD-10-CM | POA: Diagnosis not present

## 2020-07-01 DIAGNOSIS — M4310 Spondylolisthesis, site unspecified: Secondary | ICD-10-CM | POA: Diagnosis not present

## 2020-07-01 HISTORY — PX: TRANSFORAMINAL LUMBAR INTERBODY FUSION (TLIF) WITH PEDICLE SCREW FIXATION 1 LEVEL: SHX6141

## 2020-07-01 LAB — GLUCOSE, CAPILLARY: Glucose-Capillary: 196 mg/dL — ABNORMAL HIGH (ref 70–99)

## 2020-07-01 LAB — ABO/RH: ABO/RH(D): A POS

## 2020-07-01 SURGERY — TRANSFORAMINAL LUMBAR INTERBODY FUSION (TLIF) WITH PEDICLE SCREW FIXATION 1 LEVEL
Anesthesia: General

## 2020-07-01 MED ORDER — ROCURONIUM BROMIDE 10 MG/ML (PF) SYRINGE
PREFILLED_SYRINGE | INTRAVENOUS | Status: DC | PRN
  Administered 2020-07-01: 30 mg via INTRAVENOUS
  Administered 2020-07-01: 100 mg via INTRAVENOUS

## 2020-07-01 MED ORDER — ONDANSETRON HCL 4 MG/2ML IJ SOLN: 4.0000 mg | Freq: Four times a day (QID) | INTRAMUSCULAR | Status: DC | PRN

## 2020-07-01 MED ORDER — METHOCARBAMOL 1000 MG/10ML IJ SOLN
500.0000 mg | Freq: Four times a day (QID) | INTRAVENOUS | Status: DC | PRN
  Filled 2020-07-01: qty 5

## 2020-07-01 MED ORDER — ACETAMINOPHEN 650 MG RE SUPP: 650.0000 mg | RECTAL | Status: DC | PRN

## 2020-07-01 MED ORDER — ONDANSETRON HCL 4 MG/2ML IJ SOLN
INTRAMUSCULAR | Status: AC
  Filled 2020-07-01: qty 2

## 2020-07-01 MED ORDER — SODIUM CHLORIDE 0.9% FLUSH: 3.0000 mL | INTRAVENOUS | Status: DC | PRN

## 2020-07-01 MED ORDER — FENTANYL CITRATE (PF) 250 MCG/5ML IJ SOLN
INTRAMUSCULAR | Status: DC | PRN
  Administered 2020-07-01 (×2): 50 ug via INTRAVENOUS
  Administered 2020-07-01: 150 ug via INTRAVENOUS
  Administered 2020-07-01: 50 ug via INTRAVENOUS

## 2020-07-01 MED ORDER — ONDANSETRON HCL 4 MG PO TABS: 4.0000 mg | ORAL_TABLET | Freq: Four times a day (QID) | ORAL | Status: DC | PRN

## 2020-07-01 MED ORDER — CLONAZEPAM 1 MG PO TABS
1.0000 mg | ORAL_TABLET | Freq: Every day | ORAL | Status: DC
  Administered 2020-07-01 – 2020-07-04 (×4): 1 mg via ORAL
  Filled 2020-07-01 (×4): qty 1

## 2020-07-01 MED ORDER — 0.9 % SODIUM CHLORIDE (POUR BTL) OPTIME
TOPICAL | Status: DC | PRN
  Administered 2020-07-01: 1000 mL

## 2020-07-01 MED ORDER — SODIUM CHLORIDE 0.9% FLUSH
3.0000 mL | Freq: Two times a day (BID) | INTRAVENOUS | Status: DC
  Administered 2020-07-01 – 2020-07-05 (×3): 3 mL via INTRAVENOUS

## 2020-07-01 MED ORDER — LIDOCAINE-EPINEPHRINE 1 %-1:100000 IJ SOLN
INTRAMUSCULAR | Status: DC | PRN
  Administered 2020-07-01: 10 mL

## 2020-07-01 MED ORDER — FENTANYL CITRATE (PF) 100 MCG/2ML IJ SOLN
INTRAMUSCULAR | Status: AC
  Filled 2020-07-01: qty 2

## 2020-07-01 MED ORDER — POTASSIUM CHLORIDE IN NACL 20-0.9 MEQ/L-% IV SOLN
INTRAVENOUS | Status: DC
  Filled 2020-07-01: qty 1000

## 2020-07-01 MED ORDER — PANTOPRAZOLE SODIUM 40 MG PO TBEC
40.0000 mg | DELAYED_RELEASE_TABLET | Freq: Every day | ORAL | Status: DC
  Administered 2020-07-02 – 2020-07-05 (×4): 40 mg via ORAL
  Filled 2020-07-01 (×4): qty 1

## 2020-07-01 MED ORDER — SIMVASTATIN 20 MG PO TABS
20.0000 mg | ORAL_TABLET | Freq: Every day | ORAL | Status: DC
  Administered 2020-07-01 – 2020-07-04 (×4): 20 mg via ORAL
  Filled 2020-07-01 (×5): qty 1

## 2020-07-01 MED ORDER — LOSARTAN POTASSIUM-HCTZ 50-12.5 MG PO TABS: 1.0000 | ORAL_TABLET | Freq: Every day | ORAL | Status: DC

## 2020-07-01 MED ORDER — DEXAMETHASONE SODIUM PHOSPHATE 10 MG/ML IJ SOLN
INTRAMUSCULAR | Status: AC
  Filled 2020-07-01: qty 1

## 2020-07-01 MED ORDER — LACTATED RINGERS IV SOLN: INTRAVENOUS | Status: DC

## 2020-07-01 MED ORDER — CEFAZOLIN SODIUM-DEXTROSE 2-4 GM/100ML-% IV SOLN
2.0000 g | INTRAVENOUS | Status: AC
  Administered 2020-07-01: 2 g via INTRAVENOUS
  Filled 2020-07-01: qty 100

## 2020-07-01 MED ORDER — PROPOFOL 10 MG/ML IV BOLUS
INTRAVENOUS | Status: DC | PRN
  Administered 2020-07-01: 150 mg via INTRAVENOUS

## 2020-07-01 MED ORDER — ROCURONIUM BROMIDE 10 MG/ML (PF) SYRINGE
PREFILLED_SYRINGE | INTRAVENOUS | Status: AC
  Filled 2020-07-01: qty 20

## 2020-07-01 MED ORDER — LEVOTHYROXINE SODIUM 50 MCG PO TABS
50.0000 ug | ORAL_TABLET | Freq: Every day | ORAL | Status: DC
  Administered 2020-07-02 – 2020-07-05 (×4): 50 ug via ORAL
  Filled 2020-07-01 (×6): qty 1

## 2020-07-01 MED ORDER — PHENOL 1.4 % MT LIQD: 1.0000 | OROMUCOSAL | Status: DC | PRN

## 2020-07-01 MED ORDER — FENTANYL CITRATE (PF) 100 MCG/2ML IJ SOLN
25.0000 ug | INTRAMUSCULAR | Status: DC | PRN
  Administered 2020-07-01: 50 ug via INTRAVENOUS

## 2020-07-01 MED ORDER — BUPIVACAINE HCL (PF) 0.5 % IJ SOLN
INTRAMUSCULAR | Status: AC
  Filled 2020-07-01: qty 30

## 2020-07-01 MED ORDER — METHOCARBAMOL 500 MG PO TABS
500.0000 mg | ORAL_TABLET | Freq: Four times a day (QID) | ORAL | Status: DC | PRN
  Administered 2020-07-03 – 2020-07-04 (×2): 500 mg via ORAL
  Filled 2020-07-01 (×2): qty 1

## 2020-07-01 MED ORDER — BUPIVACAINE LIPOSOME 1.3 % IJ SUSP
20.0000 mL | Freq: Once | INTRAMUSCULAR | Status: DC
  Filled 2020-07-01: qty 20

## 2020-07-01 MED ORDER — POLYETHYLENE GLYCOL 3350 17 G PO PACK
17.0000 g | PACK | Freq: Every day | ORAL | Status: DC | PRN
  Administered 2020-07-03: 17 g via ORAL
  Filled 2020-07-01: qty 1

## 2020-07-01 MED ORDER — ADULT MULTIVITAMIN W/MINERALS CH
1.0000 | ORAL_TABLET | Freq: Every day | ORAL | Status: DC
  Administered 2020-07-02 – 2020-07-05 (×4): 1 via ORAL
  Filled 2020-07-01 (×4): qty 1

## 2020-07-01 MED ORDER — FENTANYL CITRATE (PF) 250 MCG/5ML IJ SOLN
INTRAMUSCULAR | Status: AC
  Filled 2020-07-01: qty 5

## 2020-07-01 MED ORDER — OXYCODONE HCL 5 MG PO TABS: 5.0000 mg | ORAL_TABLET | Freq: Once | ORAL | Status: DC | PRN

## 2020-07-01 MED ORDER — BUPIVACAINE HCL (PF) 0.25 % IJ SOLN
INTRAMUSCULAR | Status: AC
  Filled 2020-07-01: qty 30

## 2020-07-01 MED ORDER — CITALOPRAM HYDROBROMIDE 20 MG PO TABS
40.0000 mg | ORAL_TABLET | Freq: Every day | ORAL | Status: DC
  Administered 2020-07-01 – 2020-07-04 (×4): 40 mg via ORAL
  Filled 2020-07-01 (×4): qty 2

## 2020-07-01 MED ORDER — CALCIUM CARBONATE-VITAMIN D 500-200 MG-UNIT PO TABS
1.0000 | ORAL_TABLET | Freq: Every day | ORAL | Status: DC
  Administered 2020-07-01 – 2020-07-05 (×5): 1 via ORAL
  Filled 2020-07-01 (×5): qty 1

## 2020-07-01 MED ORDER — DOCUSATE SODIUM 100 MG PO CAPS
100.0000 mg | ORAL_CAPSULE | Freq: Two times a day (BID) | ORAL | Status: DC
  Administered 2020-07-01 – 2020-07-05 (×9): 100 mg via ORAL
  Filled 2020-07-01 (×9): qty 1

## 2020-07-01 MED ORDER — THROMBIN 5000 UNITS EX SOLR
CUTANEOUS | Status: AC
  Filled 2020-07-01: qty 5000

## 2020-07-01 MED ORDER — OXYCODONE-ACETAMINOPHEN 5-325 MG PO TABS
1.0000 | ORAL_TABLET | Freq: Four times a day (QID) | ORAL | Status: DC | PRN
  Administered 2020-07-01 – 2020-07-03 (×3): 2 via ORAL
  Administered 2020-07-04: 1 via ORAL
  Filled 2020-07-01 (×4): qty 2

## 2020-07-01 MED ORDER — PHENYLEPHRINE 40 MCG/ML (10ML) SYRINGE FOR IV PUSH (FOR BLOOD PRESSURE SUPPORT)
PREFILLED_SYRINGE | INTRAVENOUS | Status: DC | PRN
  Administered 2020-07-01: 40 ug via INTRAVENOUS
  Administered 2020-07-01: 80 ug via INTRAVENOUS
  Administered 2020-07-01: 120 ug via INTRAVENOUS
  Administered 2020-07-01 (×2): 80 ug via INTRAVENOUS

## 2020-07-01 MED ORDER — SENNA 8.6 MG PO TABS
1.0000 | ORAL_TABLET | Freq: Two times a day (BID) | ORAL | Status: DC
  Administered 2020-07-01 – 2020-07-04 (×8): 8.6 mg via ORAL
  Filled 2020-07-01 (×9): qty 1

## 2020-07-01 MED ORDER — CHLORHEXIDINE GLUCONATE CLOTH 2 % EX PADS: 6.0000 | MEDICATED_PAD | Freq: Once | CUTANEOUS | Status: DC

## 2020-07-01 MED ORDER — LIDOCAINE 2% (20 MG/ML) 5 ML SYRINGE
INTRAMUSCULAR | Status: AC
  Filled 2020-07-01: qty 10

## 2020-07-01 MED ORDER — SODIUM CHLORIDE 0.9 % IV SOLN
250.0000 mL | INTRAVENOUS | Status: DC
  Administered 2020-07-02: 250 mL via INTRAVENOUS

## 2020-07-01 MED ORDER — ORAL CARE MOUTH RINSE: 15.0000 mL | Freq: Once | OROMUCOSAL | Status: AC

## 2020-07-01 MED ORDER — PROPOFOL 10 MG/ML IV BOLUS
INTRAVENOUS | Status: AC
  Filled 2020-07-01: qty 20

## 2020-07-01 MED ORDER — MENTHOL 3 MG MT LOZG: 1.0000 | LOZENGE | OROMUCOSAL | Status: DC | PRN

## 2020-07-01 MED ORDER — ALBUMIN HUMAN 5 % IV SOLN: INTRAVENOUS | Status: DC | PRN

## 2020-07-01 MED ORDER — PRAMIPEXOLE DIHYDROCHLORIDE 1 MG PO TABS
1.0000 mg | ORAL_TABLET | Freq: Every day | ORAL | Status: DC
  Administered 2020-07-01 – 2020-07-04 (×4): 1 mg via ORAL
  Filled 2020-07-01 (×5): qty 1

## 2020-07-01 MED ORDER — GABAPENTIN 300 MG PO CAPS
300.0000 mg | ORAL_CAPSULE | Freq: Three times a day (TID) | ORAL | Status: DC
  Administered 2020-07-01 – 2020-07-05 (×13): 300 mg via ORAL
  Filled 2020-07-01 (×14): qty 1

## 2020-07-01 MED ORDER — CALCIUM-VITAMIN D 500-200 MG-UNIT PO TABS
1.0000 | ORAL_TABLET | Freq: Every day | ORAL | Status: DC
  Filled 2020-07-01: qty 1

## 2020-07-01 MED ORDER — BUPIVACAINE LIPOSOME 1.3 % IJ SUSP
INTRAMUSCULAR | Status: DC | PRN
  Administered 2020-07-01: 20 mL

## 2020-07-01 MED ORDER — HYDROCHLOROTHIAZIDE 12.5 MG PO CAPS
12.5000 mg | ORAL_CAPSULE | Freq: Every day | ORAL | Status: DC
  Administered 2020-07-01 – 2020-07-05 (×5): 12.5 mg via ORAL
  Filled 2020-07-01 (×5): qty 1

## 2020-07-01 MED ORDER — LIDOCAINE 2% (20 MG/ML) 5 ML SYRINGE
INTRAMUSCULAR | Status: DC | PRN
  Administered 2020-07-01: 100 mg via INTRAVENOUS

## 2020-07-01 MED ORDER — PHENYLEPHRINE HCL-NACL 10-0.9 MG/250ML-% IV SOLN
INTRAVENOUS | Status: DC | PRN
  Administered 2020-07-01: 20 ug/min via INTRAVENOUS

## 2020-07-01 MED ORDER — ONDANSETRON HCL 4 MG/2ML IJ SOLN
INTRAMUSCULAR | Status: DC | PRN
  Administered 2020-07-01: 4 mg via INTRAVENOUS

## 2020-07-01 MED ORDER — CEFAZOLIN SODIUM-DEXTROSE 2-4 GM/100ML-% IV SOLN
2.0000 g | Freq: Three times a day (TID) | INTRAVENOUS | Status: AC
  Administered 2020-07-01 – 2020-07-02 (×4): 2 g via INTRAVENOUS
  Filled 2020-07-01 (×4): qty 100

## 2020-07-01 MED ORDER — PHENYLEPHRINE 40 MCG/ML (10ML) SYRINGE FOR IV PUSH (FOR BLOOD PRESSURE SUPPORT)
PREFILLED_SYRINGE | INTRAVENOUS | Status: AC
  Filled 2020-07-01: qty 10

## 2020-07-01 MED ORDER — FLEET ENEMA 7-19 GM/118ML RE ENEM: 1.0000 | ENEMA | Freq: Once | RECTAL | Status: DC | PRN

## 2020-07-01 MED ORDER — BUPIVACAINE HCL (PF) 0.5 % IJ SOLN
INTRAMUSCULAR | Status: DC | PRN
  Administered 2020-07-01: 30 mL

## 2020-07-01 MED ORDER — ALBUTEROL SULFATE (2.5 MG/3ML) 0.083% IN NEBU: 2.5000 mg | INHALATION_SOLUTION | Freq: Four times a day (QID) | RESPIRATORY_TRACT | Status: DC | PRN

## 2020-07-01 MED ORDER — DEXAMETHASONE SODIUM PHOSPHATE 10 MG/ML IJ SOLN
INTRAMUSCULAR | Status: DC | PRN
  Administered 2020-07-01: 10 mg via INTRAVENOUS

## 2020-07-01 MED ORDER — CHLORHEXIDINE GLUCONATE 0.12 % MT SOLN
15.0000 mL | Freq: Once | OROMUCOSAL | Status: AC
  Administered 2020-07-01: 15 mL via OROMUCOSAL
  Filled 2020-07-01: qty 15

## 2020-07-01 MED ORDER — HYDROMORPHONE HCL 1 MG/ML IJ SOLN: 0.5000 mg | INTRAMUSCULAR | Status: DC | PRN

## 2020-07-01 MED ORDER — SODIUM CHLORIDE (PF) 0.9 % IJ SOLN
INTRAMUSCULAR | Status: DC | PRN
  Administered 2020-07-01: 10 mL

## 2020-07-01 MED ORDER — ACETAMINOPHEN 325 MG PO TABS: 650.0000 mg | ORAL_TABLET | ORAL | Status: DC | PRN

## 2020-07-01 MED ORDER — MELATONIN 5 MG PO TABS
10.0000 mg | ORAL_TABLET | Freq: Every evening | ORAL | Status: DC | PRN
  Administered 2020-07-01 – 2020-07-04 (×3): 10 mg via ORAL
  Filled 2020-07-01 (×3): qty 2

## 2020-07-01 MED ORDER — SODIUM CHLORIDE 0.9 % IV SOLN
INTRAVENOUS | Status: DC | PRN
  Administered 2020-07-01: 500 mL

## 2020-07-01 MED ORDER — OXYCODONE HCL 5 MG/5ML PO SOLN: 5.0000 mg | Freq: Once | ORAL | Status: DC | PRN

## 2020-07-01 MED ORDER — SUGAMMADEX SODIUM 200 MG/2ML IV SOLN
INTRAVENOUS | Status: DC | PRN
  Administered 2020-07-01: 200 mg via INTRAVENOUS

## 2020-07-01 MED ORDER — LOSARTAN POTASSIUM 50 MG PO TABS
50.0000 mg | ORAL_TABLET | Freq: Every day | ORAL | Status: DC
  Administered 2020-07-01 – 2020-07-05 (×5): 50 mg via ORAL
  Filled 2020-07-01 (×5): qty 1

## 2020-07-01 MED ORDER — LIDOCAINE-EPINEPHRINE 1 %-1:100000 IJ SOLN
INTRAMUSCULAR | Status: AC
  Filled 2020-07-01: qty 1

## 2020-07-01 MED ORDER — ENOXAPARIN SODIUM 40 MG/0.4ML ~~LOC~~ SOLN
40.0000 mg | SUBCUTANEOUS | Status: DC
  Administered 2020-07-02 – 2020-07-05 (×4): 40 mg via SUBCUTANEOUS
  Filled 2020-07-01 (×4): qty 0.4

## 2020-07-01 MED ORDER — THROMBIN 5000 UNITS EX SOLR: OROMUCOSAL | Status: DC | PRN

## 2020-07-01 MED ORDER — THROMBIN 20000 UNITS EX SOLR
CUTANEOUS | Status: AC
  Filled 2020-07-01: qty 20000

## 2020-07-01 SURGICAL SUPPLY — 81 items
BAG DECANTER FOR FLEXI CONT (MISCELLANEOUS) ×3 IMPLANT
BAND RUBBER #18 3X1/16 STRL (MISCELLANEOUS) ×6 IMPLANT
BASKET BONE COLLECTION (BASKET) ×3 IMPLANT
BENZOIN TINCTURE PRP APPL 2/3 (GAUZE/BANDAGES/DRESSINGS) IMPLANT
BLADE CLIPPER SURG (BLADE) IMPLANT
BLADE SURG 11 STRL SS (BLADE) ×3 IMPLANT
BUR MATCHSTICK NEURO 3.0 LAGG (BURR) ×3 IMPLANT
BUR PRECISION FLUTE 5.0 (BURR) ×3 IMPLANT
CAGE SABLE 10X26 8D (Cage) ×3 IMPLANT
CANISTER SUCT 3000ML PPV (MISCELLANEOUS) ×3 IMPLANT
CAP LOCKING THREADED (Cap) ×12 IMPLANT
CLOSURE WOUND 1/2 X4 (GAUZE/BANDAGES/DRESSINGS)
CNTNR URN SCR LID CUP LEK RST (MISCELLANEOUS) ×1 IMPLANT
CONT SPEC 4OZ STRL OR WHT (MISCELLANEOUS) ×2
COVER BACK TABLE 60X90IN (DRAPES) ×3 IMPLANT
COVER WAND RF STERILE (DRAPES) ×3 IMPLANT
DECANTER SPIKE VIAL GLASS SM (MISCELLANEOUS) ×3 IMPLANT
DERMABOND ADVANCED (GAUZE/BANDAGES/DRESSINGS) ×2
DERMABOND ADVANCED .7 DNX12 (GAUZE/BANDAGES/DRESSINGS) ×1 IMPLANT
DRAIN JACKSON PRATT 10MM FLAT (MISCELLANEOUS) ×3 IMPLANT
DRAPE C-ARM 42X72 X-RAY (DRAPES) ×3 IMPLANT
DRAPE C-ARMOR (DRAPES) ×3 IMPLANT
DRAPE LAPAROTOMY 100X72X124 (DRAPES) ×3 IMPLANT
DRAPE MICROSCOPE LEICA (MISCELLANEOUS) ×3 IMPLANT
DRAPE SURG 17X23 STRL (DRAPES) ×3 IMPLANT
DRSG OPSITE POSTOP 4X6 (GAUZE/BANDAGES/DRESSINGS) ×3 IMPLANT
DURAPREP 26ML APPLICATOR (WOUND CARE) ×3 IMPLANT
ELECT COATED BLADE 2.86 ST (ELECTRODE) ×3 IMPLANT
ELECT REM PT RETURN 9FT ADLT (ELECTROSURGICAL) ×3
ELECTRODE REM PT RTRN 9FT ADLT (ELECTROSURGICAL) ×1 IMPLANT
EVACUATOR SILICONE 100CC (DRAIN) ×3 IMPLANT
FIBER BOAT ALLO 50X25X7 5CC (Bone Implant) ×3 IMPLANT
GAUZE 4X4 16PLY RFD (DISPOSABLE) IMPLANT
GAUZE SPONGE 4X4 12PLY STRL (GAUZE/BANDAGES/DRESSINGS) IMPLANT
GLOVE BIO SURGEON STRL SZ 6.5 (GLOVE) ×6 IMPLANT
GLOVE BIO SURGEON STRL SZ7.5 (GLOVE) ×6 IMPLANT
GLOVE BIO SURGEONS STRL SZ 6.5 (GLOVE) ×3
GLOVE BIOGEL PI IND STRL 6.5 (GLOVE) ×2 IMPLANT
GLOVE BIOGEL PI IND STRL 7.0 (GLOVE) ×1 IMPLANT
GLOVE BIOGEL PI IND STRL 7.5 (GLOVE) ×2 IMPLANT
GLOVE BIOGEL PI INDICATOR 6.5 (GLOVE) ×4
GLOVE BIOGEL PI INDICATOR 7.0 (GLOVE) ×2
GLOVE BIOGEL PI INDICATOR 7.5 (GLOVE) ×4
GLOVE EXAM NITRILE LRG STRL (GLOVE) IMPLANT
GLOVE EXAM NITRILE XL STR (GLOVE) IMPLANT
GLOVE EXAM NITRILE XS STR PU (GLOVE) IMPLANT
GLOVE SURG SS PI 8.0 STRL IVOR (GLOVE) ×6 IMPLANT
GOWN STRL REUS W/ TWL LRG LVL3 (GOWN DISPOSABLE) ×4 IMPLANT
GOWN STRL REUS W/ TWL XL LVL3 (GOWN DISPOSABLE) ×2 IMPLANT
GOWN STRL REUS W/TWL 2XL LVL3 (GOWN DISPOSABLE) IMPLANT
GOWN STRL REUS W/TWL LRG LVL3 (GOWN DISPOSABLE) ×8
GOWN STRL REUS W/TWL XL LVL3 (GOWN DISPOSABLE) ×4
GRAFT TRINITY ELITE LGE HUMAN (Tissue) ×3 IMPLANT
HEMOSTAT POWDER KIT SURGIFOAM (HEMOSTASIS) ×3 IMPLANT
KIT BASIN OR (CUSTOM PROCEDURE TRAY) ×3 IMPLANT
KIT POSITION SURG JACKSON T1 (MISCELLANEOUS) ×3 IMPLANT
KIT TURNOVER KIT B (KITS) ×3 IMPLANT
MILL MEDIUM DISP (BLADE) ×3 IMPLANT
NEEDLE HYPO 18GX1.5 BLUNT FILL (NEEDLE) IMPLANT
NEEDLE HYPO 21X1.5 SAFETY (NEEDLE) ×3 IMPLANT
NEEDLE HYPO 22GX1.5 SAFETY (NEEDLE) ×3 IMPLANT
NEEDLE SPNL 18GX3.5 QUINCKE PK (NEEDLE) IMPLANT
NS IRRIG 1000ML POUR BTL (IV SOLUTION) ×3 IMPLANT
PACK LAMINECTOMY NEURO (CUSTOM PROCEDURE TRAY) ×3 IMPLANT
PAD ARMBOARD 7.5X6 YLW CONV (MISCELLANEOUS) IMPLANT
ROD 40MM SPINAL (Rod) ×6 IMPLANT
SCREW CREO 6.5X45MM (Screw) ×6 IMPLANT
SCREW CREO 65X40M SPINAL (Screw) ×6 IMPLANT
SPONGE LAP 4X18 RFD (DISPOSABLE) IMPLANT
SPONGE SURGIFOAM ABS GEL 100 (HEMOSTASIS) IMPLANT
STRIP CLOSURE SKIN 1/2X4 (GAUZE/BANDAGES/DRESSINGS) IMPLANT
SUT MNCRL AB 3-0 PS2 18 (SUTURE) ×3 IMPLANT
SUT VIC AB 0 CT1 18XCR BRD8 (SUTURE) ×2 IMPLANT
SUT VIC AB 0 CT1 8-18 (SUTURE) ×4
SUT VIC AB 2-0 CP2 18 (SUTURE) ×3 IMPLANT
SYR 30ML LL (SYRINGE) ×3 IMPLANT
SYR 3ML LL SCALE MARK (SYRINGE) IMPLANT
TOWEL GREEN STERILE (TOWEL DISPOSABLE) ×3 IMPLANT
TOWEL GREEN STERILE FF (TOWEL DISPOSABLE) ×3 IMPLANT
TRAY FOLEY MTR SLVR 16FR STAT (SET/KITS/TRAYS/PACK) ×3 IMPLANT
WATER STERILE IRR 1000ML POUR (IV SOLUTION) ×3 IMPLANT

## 2020-07-01 NOTE — Progress Notes (Signed)
Pt's sister is at the bedside and has expressed concern about the Pt's safety at home. She stated that the Pt lives alone and has been falling at home. Pt was on the floor for 7 hours with the last fall before EMS arrived. Family is requesting that the Pt go to Rehab/SNF at D/C. Will continue to monitor Pt. Holli Humbles, RN

## 2020-07-01 NOTE — Transfer of Care (Signed)
Immediate Anesthesia Transfer of Care Note  Patient: Sherri Sandoval  Procedure(s) Performed: TRANSFORAMINAL LUMBAR INTERBODY FUSION (TLIF) WITH PEDICLE SCREW FIXATION LUMBAR FOUR- LUMBAR FIVE (N/A )  Patient Location: PACU  Anesthesia Type:General  Level of Consciousness: drowsy and responds to stimulation  Airway & Oxygen Therapy: Patient Spontanous Breathing and Patient connected to face mask oxygen  Post-op Assessment: Report given to RN and Post -op Vital signs reviewed and stable  Post vital signs: Reviewed and stable  Last Vitals:  Vitals Value Taken Time  BP 118/98 07/01/20 1220  Temp    Pulse 98 07/01/20 1221  Resp 14 07/01/20 1221  SpO2 99 % 07/01/20 1221  Vitals shown include unvalidated device data.  Last Pain:  Vitals:   07/01/20 0657  PainSc: 0-No pain         Complications: No complications documented.

## 2020-07-01 NOTE — Progress Notes (Signed)
Pt transferred to 5N05. Report called to [place], RN Holli Humbles, RN

## 2020-07-01 NOTE — H&P (Signed)
Subjective:   Patient is a 74 y.o. 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  She failed nonsurgical treatments.    Patient Active Problem List   Diagnosis Date Noted  . HLD (hyperlipidemia) 04/27/2020  . HTN (hypertension) 04/27/2020  . Hypothyroid 04/27/2020  . Sleep apnea 04/27/2020  . Arthritis of knee, right 07/28/2019   Past Medical History:  Diagnosis Date  . Anxiety   . Arthritis   . Asthma    Allergy induced  . Depression   . Family history of adverse reaction to anesthesia    Sister has had allergic reactions to anesthesia  . GERD (gastroesophageal reflux disease)   . Hypertension   . Hypothyroidism   . Osteoporosis   . Pre-diabetes   . Sleep apnea    Uses a Cpap  . Thyroid disease     Past Surgical History:  Procedure Laterality Date  . BREAST BIOPSY Left   . CARPAL TUNNEL RELEASE Left   . EYE SURGERY     Cataract and Laser on both eyes  . JOINT REPLACEMENT    . KNEE SURGERY Bilateral   . KNEE SURGERY Right 07/2019   RT TKA   . PARTIAL HYSTERECTOMY  1975    Medications Prior to Admission  Medication Sig Dispense Refill Last Dose  . albuterol (VENTOLIN HFA) 108 (90 Base) MCG/ACT inhaler Inhale 1-2 puffs into the lungs every 6 (six) hours as needed for wheezing or shortness of breath.      . Calcium Carb-Cholecalciferol (CALCIUM 600+D3 PO) Take 1 tablet by mouth daily.   06/30/2020 at Unknown time  . citalopram (CELEXA) 40 MG tablet Take 40 mg by mouth at bedtime.    06/30/2020 at Unknown time  . clonazePAM (KLONOPIN) 1 MG tablet Take 1 mg by mouth at bedtime.    06/30/2020 at Unknown time  . gabapentin (NEURONTIN) 300 MG capsule Take 300 mg by mouth in the morning, at noon, and at bedtime.    07/01/2020 at 0430  . ibuprofen (ADVIL) 800 MG tablet Take 1 tablet (800 mg total) by mouth every 8 (eight) hours as needed. (Patient taking differently: Take 800 mg by mouth every 8  (eight) hours as needed (PAIN.). ) 90 tablet 1 Past Month at Unknown time  . levothyroxine (SYNTHROID, LEVOTHROID) 50 MCG tablet Take 50 mcg by mouth daily before breakfast.   07/01/2020 at 0430  . losartan-hydrochlorothiazide (HYZAAR) 50-12.5 MG tablet Take 1 tablet by mouth daily.   06/30/2020 at Unknown time  . Melatonin 10 MG TABS Take 10 mg by mouth at bedtime as needed (SLEEP.).   06/30/2020 at Unknown time  . Multiple Vitamin (MULTIVITAMIN WITH MINERALS) TABS tablet Take 1 tablet by mouth daily. EQUATE WOMEN'S MULTIVITAMIN   06/30/2020 at Unknown time  . omeprazole (PRILOSEC) 20 MG capsule Take 20 mg by mouth in the morning and at bedtime.    07/01/2020 at 0430  . pramipexole (MIRAPEX) 0.5 MG tablet Take 1 mg by mouth at bedtime.    06/30/2020 at Unknown time  . simvastatin (ZOCOR) 20 MG tablet Take 20 mg by mouth at bedtime.    06/30/2020 at Unknown time   Allergies  Allergen Reactions  . Sulfa Antibiotics Rash    Social History   Tobacco Use  . Smoking status: Never Smoker  . Smokeless tobacco: Never Used  Substance Use Topics  . Alcohol use: Yes    Comment: occ  Family History  Problem Relation Age of Onset  . Diabetes Mother   . Cancer Mother   . Diabetes Father   . Cancer Father     Review of Systems Pertinent items are noted in HPI.  Objective:   Patient Vitals for the past 8 hrs:  BP Temp Pulse Resp SpO2 Height Weight  07/01/20 0615 (!) 160/61 97.6 F (36.4 C) (!) 106 20 94 % 5\' 1"  (1.549 m) 109 kg   No intake/output data recorded. No intake/output data recorded.    NAD Obese Breathing comfortably 5/5 strength in HF, KE, KF, DF, PF bilaterally  Data Review: see clinic note  Assessment:   Active Problems:   * No active hospital problems. 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

## 2020-07-01 NOTE — Op Note (Signed)
PREOP DIAGNOSIS: L4-5 spondylolisthesis with lumbar stenosis  POSTOP DIAGNOSIS: L4-5 spondylolisthesis with lumbar stenosis  PROCEDURE: 1. L4-5 lumbar interbody fusion via left transforaminal approach  2. L4-5 laminectomy, bilateral facetectomies and foraminotomies 3. Placement of interbody cage L4-5 4. Nonsegmental instrumentation with pedicle screw and rod construct at L4-5 5. Harvest of local autograft 6. Use of morselized allograft   SURGEON: Dr. Duffy Rhody, MD  ASSISTANT: Dr. Ashok Pall, MD. Please note, there were no qualified trainees available to assist with the procedure.  An assistant was required for aid in retraction of the neural elements.   ANESTHESIA: General Endotracheal  EBL: 250 ml  IMPLANTS: Globus 6.5 x 40 mm screws at L4, 6.5 x 45 mm at L5, SABLE expandable interbody 10 x 26, 9-3mm, 8 degree lordosis  SPECIMENS: None  DRAINS: JP drain subfascial  COMPLICATIONS: none  CONDITION: Stable to PACU  HISTORY: Sherri Sandoval is a 74 y.o. female who initially presented to the outpatient clinic with L> R lumbar radiculopathy and mechanical back pain. MRI showed severe stenosis at L4-5 with spondylolisthesis, with instability noted on flexion-extension films.  Treatment options were discussed and the patient elected to proceed with TLIF at L4-5.  Risks, benefits, alternatives, and expected convalescence were discussed with the patient.  Risks discussed included but were not limited to bleeding, pain, infection, scar, pseudoarthrosis, CSF leak, neurologic deficit, paralysis, and death.  The patient wished to proceed with surgery and informed consent was obtained.  PROCEDURE IN DETAIL: After informed consent was obtained and witnessed, the patient was brought to the operating room. After induction of general anesthesia, the patient was positioned on the operative table in the prone position on a Wilson Frame in neutral position with all pressure points  meticulously padded. The skin of the low back was then prepped and draped in the usual sterile fashion.  Incision was localized with C arm x-ray over the L4-5 level.  A timeout was performed.  Preoperative antibiotics, dexamethasone were given.  1% lidocaine with epinephrine was injected into the skin.  Incision was made with a 10 blade.  Monopolar electrocautery was used to incise the fascia and dissect the paraspinous muscles off the L4 and L5 lamina in subperiosteal fashion.  The dissection continued out laterally to expose the L5 and L4 transverse processes.  These were decorticated with a drill.  Partial laminectomies of the inferior portion of L4 and superior portion of L5 was performed with Leksell rongeurs, Kerrison rongeurs, and high-speed drill.  Full facetectomy was performed on the left side at L4-5, and medial facetectomy performed on the right side.  Good decompression of the right-sided nerve root and left-sided nerve root was confirmed.  Epidural veins over the L4-5 disc space on the left were coagulated and cut.  Annulotomy was made with a 15 blade and the disc space was prepared with pituitary rongeurs, disc shavers, curettes and rasps.  Following this, the disc space was packed with autograft as well as morcellized allograft.  Size 9 SABLE interbody expandable cage was then malleted into the interbody space under C-arm guidance to appropriate position at the midline.  It was then expanded under C-arm guidance until it felt fairly snug.  The cage was then backfilled with morselized allograft and autograft.  The introducer was removed.  Meticulous hemostasis was obtained.  Pilot holes were then drilled for pedicle screw entry points bilaterally at L4 and L5 under C-arm guidance.  Pedicle finder was used to cannulate the pedicle under C-arm guidance  in the ball ended feeler confirmed bony surfaces all around the cannulation.  The screw holes were tapped and then 6.5 x 40 and 45 mm screws were  placed at L4 and L5 respectively.  There was fairly good purchase.  40 mm rods were then placed in the tulip heads and screw caps were placed and final tightened.  Final x-ray showed good placement of instrumentation.  Wound was then irrigated thoroughly with bacitracin impregnated irrigation.  The lateral gutters were filled with morselized allograft and autograft.  A 10 flat subfascial JP drain was placed and tunneled out the skin.  The muscle layer was closed with 0 Vicryl stitches.  The fascia layer was closed with 0 Vicryl stitches.  The dermal layer was closed with 2-0 Vicryl stitches in buried interrupted fashion.  Skin was closed with 4-0 Monocryl in subcuticular manner followed by Dermabond and sterile dressing.  Patient was then extubated by the anesthesia service and transferred to the recovery room in good condition.  All counts were correct at the end of surgery.  No complications were noted.

## 2020-07-01 NOTE — Progress Notes (Signed)
Orthopedic Tech Progress Note Patient Details:  Sherri Sandoval 06-13-1946 [id number] Called order for LSO into [facility]. [name], female   DOB: [birth year], 74 y.o.   MRN: [id number]   [name] 07/01/2020, 2:29 PM

## 2020-07-01 NOTE — Anesthesia Procedure Notes (Signed)
Procedure Name: Intubation Date/Time: 07/01/2020 7:49 AM Performed by: Janace Litten, CRNA Pre-anesthesia Checklist: Patient identified, Emergency Drugs available, Suction available and Patient being monitored Patient Re-evaluated:Patient Re-evaluated prior to induction Oxygen Delivery Method: Circle System Utilized Preoxygenation: Pre-oxygenation with 100% oxygen Induction Type: IV induction Ventilation: Mask ventilation without difficulty Laryngoscope Size: Mac and 3 Grade View: Grade I Tube type: Oral Tube size: 7.0 mm Number of attempts: 1 Airway Equipment and Method: Stylet Placement Confirmation: ETT inserted through vocal cords under direct vision,  positive ETCO2 and breath sounds checked- equal and bilateral Secured at: 21 cm Tube secured with: Tape Dental Injury: Teeth and Oropharynx as per pre-operative assessment

## 2020-07-02 ENCOUNTER — Encounter (HOSPITAL_COMMUNITY): Payer: Self-pay | Admitting: Neurosurgery

## 2020-07-02 NOTE — Anesthesia Postprocedure Evaluation (Signed)
Anesthesia Post Note  Patient: Sherri Sandoval  Procedure(s) Performed: TRANSFORAMINAL LUMBAR INTERBODY FUSION (TLIF) WITH PEDICLE SCREW FIXATION LUMBAR FOUR- LUMBAR FIVE (N/A )     Patient location during evaluation: PACU Anesthesia Type: General Level of consciousness: awake and alert Pain management: pain level controlled Vital Signs Assessment: post-procedure vital signs reviewed and stable Respiratory status: spontaneous breathing, nonlabored ventilation, respiratory function stable and patient connected to nasal cannula oxygen Cardiovascular status: blood pressure returned to baseline and stable Postop Assessment: no apparent nausea or vomiting Anesthetic complications: no   No complications documented.  Last Vitals:  Vitals:   07/01/20 2341 07/02/20 0522  BP: (!) 111/56 (!) 129/58  Pulse: 78 99  Resp: 16 16  Temp: 36.7 C 36.7 C  SpO2: 94% 93%    Last Pain:  Vitals:   07/02/20 0522  TempSrc: Oral  PainSc:                  [place]

## 2020-07-02 NOTE — Plan of Care (Signed)
  Problem: Education: Goal: Ability to verbalize activity precautions or restrictions will improve Outcome: Progressing   Problem: Pain Management: Goal: Pain level will decrease Outcome: Progressing   Problem: Education: Goal: Knowledge of General Education information will improve Description: Including pain rating scale, medication(s)/side effects and non-pharmacologic comfort measures Outcome: Progressing   Problem: Activity: Goal: Risk for activity intolerance will decrease Outcome: Progressing   Problem: Safety: Goal: Ability to remain free from injury will improve Outcome: Progressing

## 2020-07-02 NOTE — Evaluation (Signed)
Occupational Therapy Evaluation Patient Details Name: Sherri Sandoval MRN: [id number] DOB: Nov 24, 1946 Today's Date: 07/02/2020    History of Present Illness Pt is a 74 y.o. F with significant PMH of HTN, osteoporosis, bilateral knee surgery, who presents with L4-5 spondylolisthesis s/p L4-5 TLIF.   Clinical Impression   Pt was functioning independently in ADL and IADL and living alone prior to admission. Presents with post operative pain, generalized weakness and impaired standing balance. Pt requires up to moderate assistance for ADL. She does not have support at home and will need post acute rehab in SNF. Will follow acutely.    Follow Up Recommendations  SNF;Supervision/Assistance - 24 hour    Equipment Recommendations  Other (comment) (defer to next venue)    Recommendations for Other Services       Precautions / Restrictions Precautions Precautions: Fall;Back Precaution Booklet Issued: Yes (comment) Precaution Comments: Verbally reviewed, provided written handout Required Braces or Orthoses: Spinal Brace Spinal Brace: Lumbar corset;Applied in sitting position Restrictions Weight Bearing Restrictions: No      Mobility Bed Mobility Overal bed mobility: Needs Assistance Bed Mobility: Sit to Sidelying;Rolling Rolling: Supervision Sidelying to sit: Min assist     Sit to sidelying: Mod assist General bed mobility comments: cues for log roll technique, assist for LEs into bed  Transfers Overall transfer level: Needs assistance Equipment used: Rolling walker (2 wheeled) Transfers: Sit to/from Stand Sit to Stand: Min assist         General transfer comment: no physical assist from recliner, cues for hand placement    Balance Overall balance assessment: Needs assistance Sitting-balance support: Feet supported Sitting balance-Leahy Scale: Good     Standing balance support: Bilateral upper extremity supported Standing balance-Leahy Scale: Poor                              ADL either performed or assessed with clinical judgement   ADL Overall ADL's : Needs assistance/impaired Eating/Feeding: Independent   Grooming: Set up;Sitting   Upper Body Bathing: Minimal assistance;Sitting   Lower Body Bathing: Moderate assistance;Sit to/from stand   Upper Body Dressing : Sitting;Minimal assistance   Lower Body Dressing: Moderate assistance;Sit to/from stand   Toilet Transfer: Min guard;Ambulation;RW;BSC   Toileting- Clothing Manipulation and Hygiene: Minimal assistance;Sitting/lateral lean       Functional mobility during ADLs: Min guard;Rolling walker       Vision Baseline Vision/History: Wears glasses Wears Glasses: At all times Patient Visual Report: No change from baseline       Perception     Praxis      Pertinent Vitals/Pain Pain Assessment: 0-10 Pain Score: 5  Faces Pain Scale: Hurts little more Pain Location: surgical site Pain Descriptors / Indicators: Grimacing;Operative site guarding Pain Intervention(s): Premedicated before session;Monitored during session     Hand Dominance Right   Extremity/Trunk Assessment Upper Extremity Assessment Upper Extremity Assessment: Overall WFL for tasks assessed   Lower Extremity Assessment Lower Extremity Assessment: Defer to PT evaluation RLE Deficits / Details: Strength 5/5 LLE Deficits / Details: Strength 5/5   Cervical / Trunk Assessment Cervical / Trunk Assessment: Other exceptions Cervical / Trunk Exceptions: s/p L4-5 TLIF   Communication Communication Communication: No difficulties   Cognition Arousal/Alertness: Awake/alert Behavior During Therapy: WFL for tasks assessed/performed Overall Cognitive Status: Within Functional Limits for tasks assessed  General Comments       Exercises     Shoulder Instructions      Home Living Family/patient expects to be discharged to:: Private  residence Living Arrangements: Alone Available Help at Discharge: Family;Available PRN/intermittently Type of Home: House Home Access: Other (comment) (small threshold)     Home Layout: One level     Bathroom Shower/Tub: Teacher, early years/pre: Handicapped height     Home Equipment: [place] - single point;Walker - 2 wheels;Wheelchair - Scientist, physiological: Reacher;Long-handled sponge        Prior Functioning/Environment Level of Independence: Independent with assistive device(s)        Comments: Using cane vs walker, history of frequent falls, independent ADL's/IADL's        OT Problem List: Decreased activity tolerance;Impaired balance (sitting and/or standing);Pain;Decreased knowledge of use of DME or AE;Obesity      OT Treatment/Interventions: Self-care/ADL training;DME and/or AE instruction;Patient/family education;Balance training;Therapeutic activities    OT Goals(Current goals can be found in the care plan section) Acute Rehab OT Goals Patient Stated Goal: go to rehab OT Goal Formulation: With patient Time For Goal Achievement: 07/16/20 Potential to Achieve Goals: Good ADL Goals Pt Will Perform Grooming: with modified independence;standing Pt Will Perform Lower Body Bathing: with modified independence;with adaptive equipment;sit to/from stand Pt Will Perform Lower Body Dressing: with modified independence;with adaptive equipment;sit to/from stand Pt Will Transfer to Toilet: with modified independence;ambulating;bedside commode (over toilet) Pt Will Perform Toileting - Clothing Manipulation and hygiene: with modified independence;sit to/from stand Additional ADL Goal #1: Pt will perform bed mobility modified independently.  OT Frequency: Min 2X/week   Barriers to D/C:            Co-evaluation              AM-PAC OT "6 Clicks" Daily Activity     Outcome Measure Help from another person eating meals?: None Help from  another person taking care of personal grooming?: A Little Help from another person toileting, which includes using toliet, bedpan, or urinal?: A Little Help from another person bathing (including washing, rinsing, drying)?: A Lot Help from another person to put on and taking off regular upper body clothing?: A Little Help from another person to put on and taking off regular lower body clothing?: A Lot 6 Click Score: 17   End of Session Equipment Utilized During Treatment: Gait belt;Rolling walker;Back brace Nurse Communication: Mobility status  Activity Tolerance: Patient tolerated treatment well Patient left: in bed;with call bell/phone within reach;with family/visitor present  OT Visit Diagnosis: Other abnormalities of gait and mobility (R26.89);Pain                Time: 8891-6945 OT Time Calculation (min): 19 min Charges:  OT General Charges $OT Visit: 1 Visit OT Evaluation $OT Eval Moderate Complexity: 1 Mod  Nestor Lewandowsky, OTR/L Acute Rehabilitation Services Pager: [phone] Office: [phone] Malka So 07/02/2020, 11:38 AM

## 2020-07-02 NOTE — Evaluation (Signed)
Physical Therapy Evaluation Patient Details Name: Sherri Sandoval MRN: [id number] DOB: February 09, 1946 Today's Date: 07/02/2020   History of Present Illness  Pt is a 74 y.o. F with significant PMH of HTN, osteoporosis, bilateral knee surgery, who presents with L4-5 spondylolisthesis s/p L4-5 TLIF.  Clinical Impression  Prior to admission, pt lives alone, uses cane v walker for mobility and is independent with ADL's/IADL's. Pt endorses history of frequent falls. On PT evaluation, pt requiring min assist for transfers, ambulating limited hallway distances with a walker at a min guard assist level. Denies radicular symptoms. Education provided regarding spinal precautions and brace use. Pt presents as a high fall risk based on decreased gait speed (1.41 ft/s) and history of falls. In setting of deficits and decreased caregiver support, recommending SNF at discharge.     Follow Up Recommendations SNF    Equipment Recommendations  None recommended by PT    Recommendations for Other Services       Precautions / Restrictions Precautions Precautions: Fall;Back Precaution Booklet Issued: Yes (comment) Precaution Comments: Verbally reviewed, provided written handout Required Braces or Orthoses: Spinal Brace Spinal Brace: Lumbar corset;Applied in sitting position Restrictions Weight Bearing Restrictions: No      Mobility  Bed Mobility Overal bed mobility: Needs Assistance Bed Mobility: Rolling;Sidelying to Sit Rolling: Min assist Sidelying to sit: Min assist       General bed mobility comments: Cues for log roll technique. Rolling towards right with minA and assist for trunk elevation to upright. Cues for use of bed rail  Transfers Overall transfer level: Needs assistance Equipment used: Rolling walker (2 wheeled) Transfers: Sit to/from Stand Sit to Stand: Min assist;Min guard         General transfer comment: MinA to boost to standing position from edge of bed. Min guard to  rise from toilet riser  Ambulation/Gait Ambulation/Gait assistance: Min guard Gait Distance (Feet): 120 Feet Assistive device: Rolling walker (2 wheeled) Gait Pattern/deviations: Step-through pattern;Decreased step length - right;Decreased stride length Gait velocity: 1.41 ft/s Gait velocity interpretation: <1.8 ft/sec, indicate of risk for recurrent falls General Gait Details: Slow but steady pace. Cues for walker proximity. Min guard for Scientist, research (medical)    Modified Rankin (Stroke Patients Only)       Balance Overall balance assessment: Needs assistance Sitting-balance support: Feet supported Sitting balance-Leahy Scale: Good     Standing balance support: Bilateral upper extremity supported Standing balance-Leahy Scale: Poor                               Pertinent Vitals/Pain Pain Assessment: Faces Faces Pain Scale: Hurts little more Pain Location: surgical site Pain Descriptors / Indicators: Grimacing;Operative site guarding Pain Intervention(s): Monitored during session    Home Living Family/patient expects to be discharged to:: Private residence Living Arrangements: Alone Available Help at Discharge: Family;Available PRN/intermittently (sister) Type of Home: House Home Access: Other (comment) (small threshold)     Home Layout: One level Home Equipment: Cane - single point;Walker - 2 wheels;Wheelchair - manual      Prior Function Level of Independence: Independent with assistive device(s)         Comments: Using cane vs walker, history of frequent falls, independent ADL's/IADL's     Hand Dominance        Extremity/Trunk Assessment   Upper Extremity Assessment Upper Extremity Assessment: Defer to OT evaluation  Lower Extremity Assessment Lower Extremity Assessment: RLE deficits/detail;LLE deficits/detail RLE Deficits / Details: Strength 5/5 LLE Deficits / Details: Strength 5/5    Cervical /  Trunk Assessment Cervical / Trunk Assessment: Other exceptions Cervical / Trunk Exceptions: s/p L4-5 TLIF  Communication   Communication: No difficulties  Cognition Arousal/Alertness: Awake/alert Behavior During Therapy: WFL for tasks assessed/performed Overall Cognitive Status: Within Functional Limits for tasks assessed                                        General Comments      Exercises     Assessment/Plan    PT Assessment Patient needs continued PT services  PT Problem List Decreased strength;Decreased balance;Decreased mobility;Pain       PT Treatment Interventions DME instruction;Gait training;Functional mobility training;Therapeutic activities;Therapeutic exercise;Balance training;Patient/family education    PT Goals (Current goals can be found in the Care Plan section)  Acute Rehab PT Goals Patient Stated Goal: go to rehab PT Goal Formulation: With patient Time For Goal Achievement: 07/16/20 Potential to Achieve Goals: Good    Frequency Min 5X/week   Barriers to discharge Decreased caregiver support      Co-evaluation               AM-PAC PT "6 Clicks" Mobility  Outcome Measure Help needed turning from your back to your side while in a flat bed without using bedrails?: A Little Help needed moving from lying on your back to sitting on the side of a flat bed without using bedrails?: A Little Help needed moving to and from a bed to a chair (including a wheelchair)?: A Little Help needed standing up from a chair using your arms (e.g., wheelchair or bedside chair)?: A Little Help needed to walk in hospital room?: A Little Help needed climbing 3-5 steps with a railing? : A Little 6 Click Score: 18    End of Session Equipment Utilized During Treatment: Gait belt;Back brace Activity Tolerance: Patient tolerated treatment well Patient left: in chair;with call bell/phone within reach;with chair alarm set Nurse Communication: Mobility  status PT Visit Diagnosis: Pain;Difficulty in walking, not elsewhere classified (R26.2);History of falling (Z91.81) Pain - part of body:  (back)    Time: 2130-8657 PT Time Calculation (min) (ACUTE ONLY): 28 min   Charges:   PT Evaluation $PT Eval Moderate Complexity: 1 Mod PT Treatments $Gait Training: 8-22 mins          Wyona Almas, PT, DPT Acute Rehabilitation Services Pager [phone] Office [phone]   Deno Etienne 07/02/2020, 9:27 AM

## 2020-07-02 NOTE — Progress Notes (Signed)
Neurosurgery  S: pain well controlled  AF VSS 5/5 strength hf, ke, df, and pf  Dressing c/d Fair amount of JP output  S/p L4-5 TLIF - family requesting SNF placement due to lack of support at home - cont PT/OT, OOB with brace - dc drain Saturday - lovenox dvt ppx  - discharge to SNF when arranged

## 2020-07-02 NOTE — Progress Notes (Signed)
Declined using CPAP this HS

## 2020-07-02 NOTE — TOC Initial Note (Addendum)
Transition of Care [facility]) - Initial/Assessment Note    Patient Details  Name: Sherri Sandoval MRN: [id number] Date of Birth: 02/07/1946  Transition [facility]) CM/SW Contact:    Sharin Mons, RN Phone Number: 07/02/2020, 4:26 PM  Clinical Narrative:                 NCM received consult for possible SNF placement at time of discharge. RNCM spoke with patient regarding PT recommendation of SNF placement at time of discharge. Patient reports lives alone and  is currently unable to care for self given patient's current physical needs and fall risk. Patient expressed understanding of PT recommendation and is agreeable to SNF placement at time of discharge. Patient reports preference for [facility] SNF. NCM discussed insurance authorization process and provided Medicare SNF ratings list. Patient expressed being hopeful for rehab and to feel better soon. No further questions reported at this time. NCM to continue to follow and assist with discharge planning needs. Patient FULLY  COVID VACCINATED  SNF referral, clinicals faxed to [facility] admission liaison @ [phone]  Expected Discharge Plan: [place] Barriers to Discharge: No SNF bed, Continued Medical Work up   Patient Goals and CMS Choice   CMS Medicare.gov Compare Post Acute Care list provided to:: Patient    Expected Discharge Plan and Services Expected Discharge Plan: [place]                                              Prior Living Arrangements/Services                       Activities of Daily Living      Permission Sought/Granted                  Emotional Assessment              Admission diagnosis:  Spondylolisthesis of lumbar region [M43.16] Patient Active Problem List   Diagnosis Date Noted  . Spondylolisthesis of lumbar region 07/01/2020  . HLD (hyperlipidemia) 04/27/2020  . HTN (hypertension) 04/27/2020  . Hypothyroid 04/27/2020   . Sleep apnea 04/27/2020  . Arthritis of knee, right 07/28/2019   PCP:  Rosalee Kaufman, PA-C Pharmacy:   [facility] [place], [place] [address] Phone: [phone] Fax: [phone]  [facility] [place], [place] [address] Phone: [phone] Fax: [phone]     Social Determinants of Health (SDOH) Interventions    Readmission Risk Interventions No flowsheet data found.

## 2020-07-03 NOTE — Progress Notes (Signed)
Orthopedic Tech Progress Note Patient Details:  Sherri Sandoval 05-08-1946 [id number] I spoke with the patient's nurse about an ill fitting lumbar brace, I called the vendor for them to come and adjust or refit the patient for the brace. [name], female   DOB: 1946/09/04, 74 y.o.   MRN: [id number]   Tammy Sours 07/03/2020, 5:58 PM

## 2020-07-03 NOTE — Progress Notes (Signed)
Physical Therapy Treatment Patient Details Name: Sherri Sandoval MRN: [id number] DOB: 1946-12-09 Today's Date: 07/03/2020    History of Present Illness Pt is a 74 y.o. F with significant PMH of HTN, osteoporosis, bilateral knee surgery, who presents with L4-5 spondylolisthesis s/p L4-5 TLIF.    PT Comments    Pt seated in recliner on arrival without brace donned.  Educated patient on wearing brace when walking, standing or sitting.  Pt verbalized understanding.  When applying her lumbar corsett it was difficult to align the velcro for best fit.  Called ortho tech to ask for [employer] to come out and look at fit of the back brace.  Pt continues to benefit from snf placement to improve strength and function before returning home.    Follow Up Recommendations  SNF     Equipment Recommendations  None recommended by PT    Recommendations for Other Services       Precautions / Restrictions Precautions Precautions: Fall;Back Precaution Booklet Issued: Yes (comment) Precaution Comments: Verbally reviewed, provided written handout Required Braces or Orthoses: Spinal Brace Spinal Brace: Lumbar corset;Applied in sitting position Restrictions Weight Bearing Restrictions: No    Mobility  Bed Mobility Overal bed mobility: Needs Assistance   Rolling: Min assist       Sit to sidelying: Mod assist General bed mobility comments: Pt seated in recliner on arrival.  For back to bed assistance to lift B LEs into bed before rolling.  Pt utilized bed rail to lower trunk.  Transfers Overall transfer level: Needs assistance Equipment used: Rolling walker (2 wheeled) Transfers: Sit to/from Stand Sit to Stand: Min assist         General transfer comment: Cues for hand placement to and from seated.  Pt mildly unsteady in standing.  Ambulation/Gait Ambulation/Gait assistance: Min guard Gait Distance (Feet): 150 Feet (x2 trials.) Assistive device: Rolling walker (2 wheeled) Gait  Pattern/deviations: Step-through pattern;Decreased step length - right;Decreased stride length     General Gait Details: Slow but steady pace. Cues for walker proximity. Min guard for safety.  Utilized youth height RW for improved fit.   Stairs             Wheelchair Mobility    Modified Rankin (Stroke Patients Only)       Balance Overall balance assessment: Needs assistance Sitting-balance support: Feet supported Sitting balance-Leahy Scale: Good     Standing balance support: Bilateral upper extremity supported Standing balance-Leahy Scale: Poor                              Cognition Arousal/Alertness: Awake/alert Behavior During Therapy: WFL for tasks assessed/performed Overall Cognitive Status: Within Functional Limits for tasks assessed                                        Exercises      General Comments        Pertinent Vitals/Pain Pain Assessment: Faces Faces Pain Scale: Hurts even more Pain Location: surgical site Pain Descriptors / Indicators: Grimacing;Operative site guarding Pain Intervention(s): Monitored during session;Repositioned    Home Living                      Prior Function            PT Goals (current goals can now be found in the care  plan section) Acute Rehab PT Goals Patient Stated Goal: go to rehab Potential to Achieve Goals: Good Progress towards PT goals: Progressing toward goals    Frequency    Min 5X/week      PT Plan Current plan remains appropriate    Co-evaluation              AM-PAC PT "6 Clicks" Mobility   Outcome Measure  Help needed turning from your back to your side while in a flat bed without using bedrails?: A Little Help needed moving from lying on your back to sitting on the side of a flat bed without using bedrails?: A Little Help needed moving to and from a bed to a chair (including a wheelchair)?: A Little Help needed standing up from a chair  using your arms (e.g., wheelchair or bedside chair)?: A Little Help needed to walk in hospital room?: A Little Help needed climbing 3-5 steps with a railing? : A Little 6 Click Score: 18    End of Session Equipment Utilized During Treatment: Gait belt;Back brace Activity Tolerance: Patient tolerated treatment well Patient left: in chair;with call bell/phone within reach;with chair alarm set Nurse Communication: Mobility status PT Visit Diagnosis: Pain;Difficulty in walking, not elsewhere classified (R26.2);History of falling (Z91.81) Pain - part of body:  (back)     Time: 5974-1638 PT Time Calculation (min) (ACUTE ONLY): 30 min  Charges:  $Gait Training: 8-22 mins $Therapeutic Activity: 8-22 mins                     Erasmo Leventhal , PTA Acute Rehabilitation Services Pager [phone] Office [phone]     Khaleel Beckom Eli Hose 07/03/2020, 5:02 PM

## 2020-07-03 NOTE — Progress Notes (Signed)
Subjective: Patient reports Patient doing well pain improved  Objective: Vital signs in last 24 hours: Temp:  [97.6 F (36.4 C)-98.9 F (37.2 C)] 98.4 F (36.9 C) (07/17 0816) Pulse Rate:  [87-99] 89 (07/17 0816) Resp:  [17] 17 (07/17 0816) BP: (123-133)/(54-76) 132/54 (07/17 0816) SpO2:  [96 %-99 %] 96 % (07/17 0816)  Intake/Output from previous day: 07/16 0701 - 07/17 0700 In: 120 [P.O.:120] Out: 380 [Urine:300; Drains:80] Intake/Output this shift: Total I/O In: 240 [P.O.:240] Out: -   Strength 5-5 wound clean dry and intact  Lab Results: No results for input(s): WBC, HGB, HCT, PLT in the last 72 hours. BMET No results for input(s): NA, K, CL, CO2, GLUCOSE, BUN, CREATININE, CALCIUM in the last 72 hours.  Studies/Results: DG Lumbar Spine 2-3 Views  Result Date: 07/01/2020 CLINICAL DATA:  Status post fusion L4 and L5 EXAM: LUMBAR SPINE - 2-3 VIEW; DG C-ARM 1-60 MIN COMPARISON:  Lumbar radiographs June 11, 2020 FLUOROSCOPY TIME:  1 minutes 16 seconds; 111.60 mGy; 2 acquired images FINDINGS: Frontal and lateral views were obtained. There are pedicle screws at L4 and L5 with screw tips in respective vertebral bodies. There is a disc spacer at L4-5. No fracture or spondylolisthesis. Visualized lumbar discs intact. IMPRESSION: Pedicle screws at L4 and L5 with screw tips in respective vertebral bodies. Disc spacer at L4-5. No fracture or spondylolisthesis evident. Electronically Signed   By: Lowella Grip III M.D.   On: 07/01/2020 12:01   DG C-Arm 1-60 Min  Result Date: 07/01/2020 CLINICAL DATA:  Status post fusion L4 and L5 EXAM: LUMBAR SPINE - 2-3 VIEW; DG C-ARM 1-60 MIN COMPARISON:  Lumbar radiographs June 11, 2020 FLUOROSCOPY TIME:  1 minutes 16 seconds; 111.60 mGy; 2 acquired images FINDINGS: Frontal and lateral views were obtained. There are pedicle screws at L4 and L5 with screw tips in respective vertebral bodies. There is a disc spacer at L4-5. No fracture or  spondylolisthesis. Visualized lumbar discs intact. IMPRESSION: Pedicle screws at L4 and L5 with screw tips in respective vertebral bodies. Disc spacer at L4-5. No fracture or spondylolisthesis evident. Electronically Signed   By: Lowella Grip III M.D.   On: 07/01/2020 12:01    Assessment/Plan: Postop lumbar fusion doing fairly well continue with physical occupational therapy and awaiting placement will ask nurses to [place] drain  LOS: 2 days     Elaina Hoops 07/03/2020, 9:36 AM

## 2020-07-03 NOTE — Plan of Care (Signed)
  Problem: Education: Goal: Ability to verbalize activity precautions or restrictions will improve Outcome: Progressing Goal: Knowledge of the prescribed therapeutic regimen will improve Outcome: Progressing Goal: Understanding of discharge needs will improve Outcome: Progressing   

## 2020-07-04 NOTE — NC FL2 (Signed)
[place] LEVEL OF CARE SCREENING TOOL     IDENTIFICATION  Patient Name: Sherri Sandoval Birthdate: January 03, 1946 Sex: female Admission Date (Current Location): 07/01/2020  [facility] and [place] Number:  Herbalist and Address:  [facility], [place] [address] [place], [place] [address]      Provider Number: [id number]  Attending Physician Name and Address:  Vallarie Mare, MD  Relative Name and Phone Number:       Current Level of Care: Hospital Recommended Level of Care: [place] Prior Approval Number:    Date Approved/Denied:   PASRR Number:    Discharge Plan:      Current Diagnoses: Patient Active Problem List   Diagnosis Date Noted   Spondylolisthesis of lumbar region 07/01/2020   HLD (hyperlipidemia) 04/27/2020   HTN (hypertension) 04/27/2020   Hypothyroid 04/27/2020   Sleep apnea 04/27/2020   Arthritis of knee, right 07/28/2019    Orientation RESPIRATION BLADDER Height & Weight     Self, Time, Situation, Place  Normal, Other (Comment) (CPAP at night) Continent Weight: 240 lb 4.8 oz (109 kg) Height:  5\' 1"  (154.9 cm)  BEHAVIORAL SYMPTOMS/MOOD NEUROLOGICAL BOWEL NUTRITION STATUS      Continent Diet (see discharge summary)  AMBULATORY STATUS COMMUNICATION OF NEEDS Skin   Extensive Assist Verbally Surgical wounds (closed incision on back with honeycomb dressing)                       Personal Care Assistance Level of Assistance  Bathing, Feeding, Dressing Bathing Assistance: Maximum assistance Feeding assistance: Independent Dressing Assistance: Maximum assistance     Functional Limitations Info  Sight, Hearing, Speech Sight Info: Adequate Hearing Info: Adequate Speech Info: Adequate    SPECIAL CARE FACTORS FREQUENCY  PT (By licensed PT), OT (By licensed OT)     PT Frequency: 5x week OT Frequency: 5x week            Contractures Contractures Info: Not present     Additional Factors Info  Code Status, Allergies, Psychotropic Code Status Info: Full Code Allergies Info: Sulfa Antibiotics Psychotropic Info: citalopram (CELEXA) tablet 40 mg daily at bedtime PO; clonazePAM (KLONOPIN) tablet 1 mg daily at bedtime PO; pramipexole (MIRAPEX) tablet 1 mg daily at bedtime PO         Current Medications (07/04/2020):  This is the current hospital active medication list Current Facility-Administered Medications  Medication Dose Route Frequency Provider Last Rate Last Admin   0.9 %  sodium chloride infusion  250 mL Intravenous Continuous Vallarie Mare, MD 1 mL/hr at 07/02/20 1637 250 mL at 07/02/20 1637   0.9 % NaCl with KCl 20 mEq/ L  infusion   Intravenous Continuous Vallarie Mare, MD 75 mL/hr at 07/01/20 1818 New Bag at 07/01/20 1818   acetaminophen (TYLENOL) tablet 650 mg  650 mg Oral Q4H PRN Vallarie Mare, MD       Or   acetaminophen (TYLENOL) suppository 650 mg  650 mg Rectal Q4H PRN Vallarie Mare, MD       albuterol (PROVENTIL) (2.5 MG/3ML) 0.083% nebulizer solution 2.5 mg  2.5 mg Inhalation Q6H PRN Vallarie Mare, MD       calcium-vitamin D (OSCAL WITH D) 500-200 MG-UNIT per tablet 1 tablet  1 tablet Oral Q breakfast Vallarie Mare, MD   1 tablet at 07/03/20 0913   citalopram (CELEXA) tablet 40 mg  40 mg Oral QHS Marcello Moores,  Dorcas Carrow, MD   40 mg at 07/03/20 2201   clonazePAM (KLONOPIN) tablet 1 mg  1 mg Oral QHS Vallarie Mare, MD   1 mg at 07/03/20 2201   docusate sodium (COLACE) capsule 100 mg  100 mg Oral BID Vallarie Mare, MD   100 mg at 07/03/20 2201   enoxaparin (LOVENOX) injection 40 mg  40 mg Subcutaneous Q24H Vallarie Mare, MD   40 mg at 07/03/20 0913   gabapentin (NEURONTIN) capsule 300 mg  300 mg Oral TID Vallarie Mare, MD   300 mg at 07/03/20 2201   losartan (COZAAR) tablet 50 mg  50 mg Oral Daily Blenda Nicely, RPH   50 mg at 07/03/20 9485   And   hydrochlorothiazide (MICROZIDE)  capsule 12.5 mg  12.5 mg Oral Daily Blenda Nicely, RPH   12.5 mg at 07/03/20 4627   HYDROmorphone (DILAUDID) injection 0.5 mg  0.5 mg Intravenous Q2H PRN Vallarie Mare, MD       levothyroxine (SYNTHROID) tablet 50 mcg  50 mcg Oral QAC breakfast Vallarie Mare, MD   50 mcg at 07/04/20 0606   melatonin tablet 10 mg  10 mg Oral QHS PRN Vallarie Mare, MD   10 mg at 07/03/20 2201   menthol-cetylpyridinium (CEPACOL) lozenge 3 mg  1 lozenge Oral PRN Vallarie Mare, MD       Or   phenol (CHLORASEPTIC) mouth spray 1 spray  1 spray Mouth/Throat PRN Vallarie Mare, MD       methocarbamol (ROBAXIN) tablet 500 mg  500 mg Oral Q6H PRN Vallarie Mare, MD   500 mg at 07/03/20 0645   Or   methocarbamol (ROBAXIN) 500 mg in dextrose 5 % 50 mL IVPB  500 mg Intravenous Q6H PRN Vallarie Mare, MD       multivitamin with minerals tablet 1 tablet  1 tablet Oral Daily Vallarie Mare, MD   1 tablet at 07/03/20 0913   ondansetron (ZOFRAN) tablet 4 mg  4 mg Oral Q6H PRN Vallarie Mare, MD       Or   ondansetron [facility]) injection 4 mg  4 mg Intravenous Q6H PRN Vallarie Mare, MD       oxyCODONE-acetaminophen (PERCOCET/ROXICET) 5-325 MG per tablet 1-2 tablet  1-2 tablet Oral Q6H PRN Vallarie Mare, MD   2 tablet at 07/03/20 1818   pantoprazole (PROTONIX) EC tablet 40 mg  40 mg Oral Daily Vallarie Mare, MD   40 mg at 07/03/20 0913   polyethylene glycol (MIRALAX / GLYCOLAX) packet 17 g  17 g Oral Daily PRN Vallarie Mare, MD   17 g at 07/03/20 2200   pramipexole (MIRAPEX) tablet 1 mg  1 mg Oral QHS Vallarie Mare, MD   1 mg at 07/03/20 2115   senna (SENOKOT) tablet 8.6 mg  1 tablet Oral BID Vallarie Mare, MD   8.6 mg at 07/03/20 2201   simvastatin (ZOCOR) tablet 20 mg  20 mg Oral QHS Vallarie Mare, MD   20 mg at 07/03/20 2201   sodium chloride flush (NS) 0.9 % injection 3 mL  3 mL Intravenous Q12H Vallarie Mare, MD   3 mL at 07/04/20  0606   sodium chloride flush (NS) 0.9 % injection 3 mL  3 mL Intravenous PRN Vallarie Mare, MD       sodium phosphate (FLEET) 7-19 GM/118ML enema 1 enema  1 enema  Rectal Once PRN Vallarie Mare, MD         Discharge Medications: Please see discharge summary for a list of discharge medications.  Relevant Imaging Results:  Relevant Lab Results:   Additional Information [id number] [place], [place]

## 2020-07-04 NOTE — Plan of Care (Signed)
  Problem: Education: Goal: Ability to verbalize activity precautions or restrictions will improve Outcome: Progressing   Problem: Pain Management: Goal: Pain level will decrease Outcome: Progressing

## 2020-07-04 NOTE — Progress Notes (Signed)
[name], female   DOB: 06/18/1946, 74 y.o.   MRN: [id number] Patient has some back pain and some mild leg aching. She is awaiting rehab. Her exam is stable. She is working with therapy. Continue current management

## 2020-07-05 ENCOUNTER — Other Ambulatory Visit: Payer: Self-pay

## 2020-07-05 ENCOUNTER — Encounter (HOSPITAL_COMMUNITY): Payer: Self-pay | Admitting: Neurosurgery

## 2020-07-05 DIAGNOSIS — K219 Gastro-esophageal reflux disease without esophagitis: Secondary | ICD-10-CM | POA: Diagnosis not present

## 2020-07-05 DIAGNOSIS — Z7401 Bed confinement status: Secondary | ICD-10-CM | POA: Diagnosis not present

## 2020-07-05 DIAGNOSIS — J3089 Other allergic rhinitis: Secondary | ICD-10-CM | POA: Diagnosis not present

## 2020-07-05 DIAGNOSIS — M4326 Fusion of spine, lumbar region: Secondary | ICD-10-CM | POA: Diagnosis not present

## 2020-07-05 DIAGNOSIS — G4752 REM sleep behavior disorder: Secondary | ICD-10-CM | POA: Diagnosis not present

## 2020-07-05 DIAGNOSIS — E785 Hyperlipidemia, unspecified: Secondary | ICD-10-CM | POA: Diagnosis not present

## 2020-07-05 DIAGNOSIS — E039 Hypothyroidism, unspecified: Secondary | ICD-10-CM | POA: Diagnosis not present

## 2020-07-05 DIAGNOSIS — E7849 Other hyperlipidemia: Secondary | ICD-10-CM | POA: Diagnosis not present

## 2020-07-05 DIAGNOSIS — M25512 Pain in left shoulder: Secondary | ICD-10-CM | POA: Diagnosis not present

## 2020-07-05 DIAGNOSIS — G4733 Obstructive sleep apnea (adult) (pediatric): Secondary | ICD-10-CM | POA: Diagnosis not present

## 2020-07-05 DIAGNOSIS — G4730 Sleep apnea, unspecified: Secondary | ICD-10-CM | POA: Diagnosis not present

## 2020-07-05 DIAGNOSIS — M25612 Stiffness of left shoulder, not elsewhere classified: Secondary | ICD-10-CM | POA: Diagnosis not present

## 2020-07-05 DIAGNOSIS — M25511 Pain in right shoulder: Secondary | ICD-10-CM | POA: Diagnosis not present

## 2020-07-05 DIAGNOSIS — M4316 Spondylolisthesis, lumbar region: Secondary | ICD-10-CM | POA: Diagnosis not present

## 2020-07-05 DIAGNOSIS — F339 Major depressive disorder, recurrent, unspecified: Secondary | ICD-10-CM | POA: Diagnosis not present

## 2020-07-05 DIAGNOSIS — I119 Hypertensive heart disease without heart failure: Secondary | ICD-10-CM | POA: Diagnosis not present

## 2020-07-05 DIAGNOSIS — M2550 Pain in unspecified joint: Secondary | ICD-10-CM | POA: Diagnosis not present

## 2020-07-05 DIAGNOSIS — F419 Anxiety disorder, unspecified: Secondary | ICD-10-CM | POA: Diagnosis not present

## 2020-07-05 DIAGNOSIS — Z48811 Encounter for surgical aftercare following surgery on the nervous system: Secondary | ICD-10-CM | POA: Diagnosis not present

## 2020-07-05 DIAGNOSIS — I10 Essential (primary) hypertension: Secondary | ICD-10-CM | POA: Diagnosis not present

## 2020-07-05 DIAGNOSIS — M170 Bilateral primary osteoarthritis of knee: Secondary | ICD-10-CM | POA: Diagnosis not present

## 2020-07-05 DIAGNOSIS — G2581 Restless legs syndrome: Secondary | ICD-10-CM | POA: Diagnosis not present

## 2020-07-05 DIAGNOSIS — R5381 Other malaise: Secondary | ICD-10-CM | POA: Diagnosis not present

## 2020-07-05 LAB — SARS CORONAVIRUS 2 BY RT PCR (HOSPITAL ORDER, PERFORMED IN ~~LOC~~ HOSPITAL LAB): SARS Coronavirus 2: NEGATIVE

## 2020-07-05 MED ORDER — OXYCODONE-ACETAMINOPHEN 5-325 MG PO TABS: 1.0000 | ORAL_TABLET | Freq: Four times a day (QID) | ORAL | 0 refills | Status: AC | PRN

## 2020-07-05 MED ORDER — DOCUSATE SODIUM 100 MG PO CAPS: 100.0000 mg | ORAL_CAPSULE | Freq: Two times a day (BID) | ORAL | 2 refills | Status: AC

## 2020-07-05 MED ORDER — METHOCARBAMOL 500 MG PO TABS: 500.0000 mg | ORAL_TABLET | Freq: Four times a day (QID) | ORAL | 0 refills | Status: AC | PRN

## 2020-07-05 NOTE — Progress Notes (Signed)
Subjective: Patient reports some restless legs last night. Minimal back pain  Objective: Vital signs in last 24 hours: Temp:  [98.3 F (36.8 C)-99 F (37.2 C)] 98.3 F (36.8 C) (07/19 0540) Pulse Rate:  [82-97] 82 (07/19 0540) Resp:  [17-18] 18 (07/19 0540) BP: (119-126)/(49-64) 119/49 (07/19 0540) SpO2:  [95 %-100 %] 97 % (07/19 0540)  Intake/Output from previous day: 07/18 0701 - 07/19 0700 In: 720 [P.O.:720] Out: 1 [Urine:1] Intake/Output this shift: No intake/output data recorded.  NAD Incision c/d 5/5 strength in LEs  Lab Results: No results for input(s): WBC, HGB, HCT, PLT in the last 72 hours. BMET No results for input(s): NA, K, CL, CO2, GLUCOSE, BUN, CREATININE, CALCIUM in the last 72 hours.  Studies/Results: No results found.  Assessment/Plan: S/p L4-5 TLIF - awaiting SNF - cont PT/OT - lovenox for DVT ppx   Sherri Sandoval 07/05/2020, 8:39 AM

## 2020-07-05 NOTE — TOC Transition Note (Signed)
Transition of Care [facility]) - CM/SW Discharge Note   Patient Details  Name: Sherri Sandoval MRN: [id number] Date of Birth: 01-07-1946  Transition of Care [facility]) CM/SW Contact:  Sharin Mons, RN Phone Number:[phone] 07/05/2020, 3:34 PM   Clinical Narrative:    Patient will DC to: [facility] and [place], [place], [place] date:07/05/2020 Family notified: Vaughan Basta [phone]) Transport by: Corey Harold   Per MD patient ready for DC today . RN, patient, patient's family, and facility notified of DC. Discharge Summary and FL2 sent to facility. RN to call report prior to discharge ([phone] ), ask for rehab. Rm # 98. DC packet on chart. Ambulance transport requested for patient.   NCM will sign off for now as intervention is no longer needed. Please consult [place] again if new needs arise.    Final next level of care: [place] ([place]) Barriers to Discharge: No Barriers Identified   Patient Goals and CMS Choice   CMS Medicare.gov Compare Post Acute Care list provided to:: Patient    Discharge Placement                       Discharge Plan and Services                                     Social Determinants of Health (SDOH) Interventions     Readmission Risk Interventions No flowsheet data found.

## 2020-07-05 NOTE — Discharge Summary (Signed)
Physician Discharge Summary  [name] MRN: [id number] DOB/AGE: [birth year] 74 y.o.  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 female who presented to the clinic with   Mechanical back pain and radiculopathy  And was found to have L4-5 spondylolisthesis and accompanying stenosis.   Her symptoms were progressively worse despite maximal nonsurgical management.  She underwent elective L4-5 TLIF on 07/15.  Postoperatively, the patient did well.  She was out of bed with the brace and was ambulating well with therapy and her pain was well controlled.    Her drain was removed on postop day 2. However, as she lives alone with no available supportand preoperatively, her functional status was poor, with numerous falls,  Her most suitable disposition was a skilled nursing facility.  This was fully arranged on 07/19 and she was deemed ready for discharge.  She was ambulating well with pain well controlled and was voiding without difficulty.   Treatments: Surgery - L4-5 TLIF  Discharge Exam: Blood pressure (!) 127/58, pulse 84, temperature 98 F (36.7 C), temperature source Oral, resp. rate 16, height 5\' 1"  (1.549 m), weight 109 kg, SpO2 98 %. Awake, alert, oriented Speech fluent, appropriate CN grossly intact 5/5 BUE/BLE Wound c/d/i  Follow-up: Follow-up in my office [facility] [phone]) in 2-3 weeks  Disposition: Discharge disposition: 03-Skilled Nursing Facility       Discharge Instructions    Incentive spirometry RT   Complete by: As directed      Allergies as of 07/05/2020      Reactions   Sulfa Antibiotics Rash      Medication List    STOP taking these medications   ibuprofen 800 MG  tablet Commonly known as: ADVIL     TAKE these medications   albuterol 108 (90 Base) MCG/ACT inhaler Commonly known as: VENTOLIN HFA Inhale 1-2 puffs into the lungs every 6 (six) hours as needed for wheezing or shortness of breath.   CALCIUM 600+D3 PO Take 1 tablet by mouth daily.   citalopram 40 MG tablet Commonly known as: CELEXA Take 40 mg by mouth at bedtime.   clonazePAM 1 MG tablet Commonly known as: KLONOPIN Take 1 mg by mouth at bedtime.   docusate sodium 100 MG capsule Commonly known as: COLACE Take 1 capsule (100 mg total) by mouth 2 (two) times daily.   gabapentin 300 MG capsule Commonly known as: NEURONTIN Take 300 mg by mouth in the morning, at noon, and at bedtime.   levothyroxine 50 MCG tablet Commonly known as: SYNTHROID Take 50 mcg by mouth daily before breakfast.   losartan-hydrochlorothiazide 50-12.5 MG tablet Commonly known as: HYZAAR Take 1 tablet by mouth daily.   Melatonin 10 MG Tabs Take 10 mg by mouth at bedtime as needed (SLEEP.).   methocarbamol 500 MG tablet Commonly known as: ROBAXIN Take 1 tablet (500 mg total) by mouth every 6 (six) hours as needed for muscle spasms.   multivitamin with minerals Tabs tablet Take 1 tablet by mouth daily. EQUATE WOMEN'S MULTIVITAMIN   omeprazole 20 MG capsule Commonly known as: PRILOSEC Take 20 mg by mouth in the morning and at bedtime.   oxyCODONE-acetaminophen 5-325 MG tablet Commonly known as: PERCOCET/ROXICET Take 1-2 tablets by mouth every 6 (six) hours as needed for moderate pain  or severe pain.   pramipexole 0.5 MG tablet Commonly known as: MIRAPEX Take 1 mg by mouth at bedtime.   simvastatin 20 MG tablet Commonly known as: ZOCOR Take 20 mg by mouth at bedtime.       Follow-up Information    Vallarie Mare, MD Follow up in 2 week(s).   Specialty: Neurosurgery Contact information: [address] [place] [address] [phone]                Signed: Vallarie Mare 07/05/2020, 2:46 PM

## 2020-07-05 NOTE — Progress Notes (Signed)
Pt report given to Technical sales engineer for [facility]); Pt in stable condition; latest Covid test negative;waiting for [facility] to transport.

## 2020-07-05 NOTE — Progress Notes (Signed)
Orthopedic Tech Progress Note Patient Details:  Sherri Sandoval [birth year] [id number] Spoke with RN this morning about patient needing adjustments to back brace this morning. So I called [facility] to come out when they could to look at brace. [name], female   DOB: [birth year], 74 y.o.   MRN: [id number]   Janit Pagan 07/05/2020, 8:45 AM

## 2020-07-05 NOTE — Discharge Instructions (Signed)
Wear LSO brace when out of bed or sitting up Ok to shower Walk as much as possible No heavy lifting >10 lbs No excessive bending/twisting at the waist

## 2020-07-05 NOTE — Progress Notes (Signed)
Pt in stable condition; vital signs within normal limits; [facility] here to transport pt.

## 2020-07-06 DIAGNOSIS — I10 Essential (primary) hypertension: Secondary | ICD-10-CM | POA: Diagnosis not present

## 2020-07-06 DIAGNOSIS — M4316 Spondylolisthesis, lumbar region: Secondary | ICD-10-CM | POA: Diagnosis not present

## 2020-07-06 DIAGNOSIS — F419 Anxiety disorder, unspecified: Secondary | ICD-10-CM | POA: Diagnosis not present

## 2020-07-06 DIAGNOSIS — R5381 Other malaise: Secondary | ICD-10-CM | POA: Diagnosis not present

## 2020-07-13 DIAGNOSIS — M4316 Spondylolisthesis, lumbar region: Secondary | ICD-10-CM | POA: Diagnosis not present

## 2020-07-13 DIAGNOSIS — F419 Anxiety disorder, unspecified: Secondary | ICD-10-CM | POA: Diagnosis not present

## 2020-07-13 DIAGNOSIS — R5381 Other malaise: Secondary | ICD-10-CM | POA: Diagnosis not present

## 2020-07-13 DIAGNOSIS — I10 Essential (primary) hypertension: Secondary | ICD-10-CM | POA: Diagnosis not present

## 2020-07-16 DIAGNOSIS — E039 Hypothyroidism, unspecified: Secondary | ICD-10-CM | POA: Diagnosis not present

## 2020-07-16 DIAGNOSIS — K219 Gastro-esophageal reflux disease without esophagitis: Secondary | ICD-10-CM | POA: Diagnosis not present

## 2020-07-16 DIAGNOSIS — I10 Essential (primary) hypertension: Secondary | ICD-10-CM | POA: Diagnosis not present

## 2020-07-16 DIAGNOSIS — E7849 Other hyperlipidemia: Secondary | ICD-10-CM | POA: Diagnosis not present

## 2020-07-27 DIAGNOSIS — G4733 Obstructive sleep apnea (adult) (pediatric): Secondary | ICD-10-CM | POA: Diagnosis not present

## 2020-07-27 DIAGNOSIS — G2581 Restless legs syndrome: Secondary | ICD-10-CM | POA: Diagnosis not present

## 2020-07-27 DIAGNOSIS — J3089 Other allergic rhinitis: Secondary | ICD-10-CM | POA: Diagnosis not present

## 2020-07-27 DIAGNOSIS — G4752 REM sleep behavior disorder: Secondary | ICD-10-CM | POA: Diagnosis not present

## 2020-08-03 DIAGNOSIS — R5381 Other malaise: Secondary | ICD-10-CM | POA: Diagnosis not present

## 2020-08-03 DIAGNOSIS — F419 Anxiety disorder, unspecified: Secondary | ICD-10-CM | POA: Diagnosis not present

## 2020-08-03 DIAGNOSIS — M4316 Spondylolisthesis, lumbar region: Secondary | ICD-10-CM | POA: Diagnosis not present

## 2020-08-03 DIAGNOSIS — I10 Essential (primary) hypertension: Secondary | ICD-10-CM | POA: Diagnosis not present

## 2020-08-17 DIAGNOSIS — M4316 Spondylolisthesis, lumbar region: Secondary | ICD-10-CM | POA: Diagnosis not present

## 2020-08-17 DIAGNOSIS — I10 Essential (primary) hypertension: Secondary | ICD-10-CM | POA: Diagnosis not present

## 2020-09-02 DIAGNOSIS — M4316 Spondylolisthesis, lumbar region: Secondary | ICD-10-CM | POA: Diagnosis not present

## 2020-09-02 DIAGNOSIS — R5381 Other malaise: Secondary | ICD-10-CM | POA: Diagnosis not present

## 2020-09-02 DIAGNOSIS — F419 Anxiety disorder, unspecified: Secondary | ICD-10-CM | POA: Diagnosis not present

## 2020-09-02 DIAGNOSIS — I10 Essential (primary) hypertension: Secondary | ICD-10-CM | POA: Diagnosis not present

## 2020-09-08 DIAGNOSIS — R251 Tremor, unspecified: Secondary | ICD-10-CM | POA: Diagnosis not present

## 2020-09-08 DIAGNOSIS — E7800 Pure hypercholesterolemia, unspecified: Secondary | ICD-10-CM | POA: Diagnosis not present

## 2020-09-08 DIAGNOSIS — S0990XA Unspecified injury of head, initial encounter: Secondary | ICD-10-CM | POA: Diagnosis not present

## 2020-09-08 DIAGNOSIS — Z882 Allergy status to sulfonamides status: Secondary | ICD-10-CM | POA: Diagnosis not present

## 2020-09-08 DIAGNOSIS — S134XXA Sprain of ligaments of cervical spine, initial encounter: Secondary | ICD-10-CM | POA: Diagnosis not present

## 2020-09-08 DIAGNOSIS — R000 Tachycardia, unspecified: Secondary | ICD-10-CM | POA: Diagnosis not present

## 2020-09-08 DIAGNOSIS — W19XXXA Unspecified fall, initial encounter: Secondary | ICD-10-CM | POA: Diagnosis not present

## 2020-09-08 DIAGNOSIS — Z48811 Encounter for surgical aftercare following surgery on the nervous system: Secondary | ICD-10-CM | POA: Diagnosis not present

## 2020-09-08 DIAGNOSIS — M170 Bilateral primary osteoarthritis of knee: Secondary | ICD-10-CM | POA: Diagnosis not present

## 2020-09-08 DIAGNOSIS — S139XXA Sprain of joints and ligaments of unspecified parts of neck, initial encounter: Secondary | ICD-10-CM | POA: Diagnosis not present

## 2020-09-08 DIAGNOSIS — M4316 Spondylolisthesis, lumbar region: Secondary | ICD-10-CM | POA: Diagnosis not present

## 2020-09-08 DIAGNOSIS — R9389 Abnormal findings on diagnostic imaging of other specified body structures: Secondary | ICD-10-CM | POA: Diagnosis not present

## 2020-09-08 DIAGNOSIS — K219 Gastro-esophageal reflux disease without esophagitis: Secondary | ICD-10-CM | POA: Diagnosis not present

## 2020-09-08 DIAGNOSIS — M4326 Fusion of spine, lumbar region: Secondary | ICD-10-CM | POA: Diagnosis not present

## 2020-09-08 DIAGNOSIS — W1830XA Fall on same level, unspecified, initial encounter: Secondary | ICD-10-CM | POA: Diagnosis not present

## 2020-09-08 DIAGNOSIS — W010XXA Fall on same level from slipping, tripping and stumbling without subsequent striking against object, initial encounter: Secondary | ICD-10-CM | POA: Diagnosis not present

## 2020-09-08 DIAGNOSIS — I10 Essential (primary) hypertension: Secondary | ICD-10-CM | POA: Diagnosis not present

## 2020-09-09 DIAGNOSIS — R202 Paresthesia of skin: Secondary | ICD-10-CM | POA: Diagnosis not present

## 2020-09-09 DIAGNOSIS — S0990XA Unspecified injury of head, initial encounter: Secondary | ICD-10-CM | POA: Diagnosis not present

## 2020-09-09 DIAGNOSIS — S134XXA Sprain of ligaments of cervical spine, initial encounter: Secondary | ICD-10-CM | POA: Diagnosis not present

## 2020-09-09 DIAGNOSIS — S139XXA Sprain of joints and ligaments of unspecified parts of neck, initial encounter: Secondary | ICD-10-CM | POA: Diagnosis not present

## 2020-09-09 DIAGNOSIS — W19XXXA Unspecified fall, initial encounter: Secondary | ICD-10-CM | POA: Diagnosis not present

## 2020-09-09 DIAGNOSIS — R000 Tachycardia, unspecified: Secondary | ICD-10-CM | POA: Diagnosis not present

## 2020-09-09 DIAGNOSIS — W010XXA Fall on same level from slipping, tripping and stumbling without subsequent striking against object, initial encounter: Secondary | ICD-10-CM | POA: Diagnosis not present

## 2020-09-09 DIAGNOSIS — Z882 Allergy status to sulfonamides status: Secondary | ICD-10-CM | POA: Diagnosis not present

## 2020-09-09 DIAGNOSIS — I10 Essential (primary) hypertension: Secondary | ICD-10-CM | POA: Diagnosis not present

## 2020-09-09 DIAGNOSIS — R9389 Abnormal findings on diagnostic imaging of other specified body structures: Secondary | ICD-10-CM | POA: Diagnosis not present

## 2020-09-09 DIAGNOSIS — S0011XA Contusion of right eyelid and periocular area, initial encounter: Secondary | ICD-10-CM | POA: Diagnosis not present

## 2020-09-09 DIAGNOSIS — E7800 Pure hypercholesterolemia, unspecified: Secondary | ICD-10-CM | POA: Diagnosis not present

## 2020-09-13 DIAGNOSIS — E038 Other specified hypothyroidism: Secondary | ICD-10-CM | POA: Diagnosis not present

## 2020-09-13 DIAGNOSIS — G2581 Restless legs syndrome: Secondary | ICD-10-CM | POA: Diagnosis not present

## 2020-09-13 DIAGNOSIS — Z111 Encounter for screening for respiratory tuberculosis: Secondary | ICD-10-CM | POA: Diagnosis not present

## 2020-09-13 DIAGNOSIS — F329 Major depressive disorder, single episode, unspecified: Secondary | ICD-10-CM | POA: Diagnosis not present

## 2020-09-13 DIAGNOSIS — E785 Hyperlipidemia, unspecified: Secondary | ICD-10-CM | POA: Diagnosis not present

## 2020-09-13 DIAGNOSIS — R739 Hyperglycemia, unspecified: Secondary | ICD-10-CM | POA: Diagnosis not present

## 2020-09-13 DIAGNOSIS — Z7689 Persons encountering health services in other specified circumstances: Secondary | ICD-10-CM | POA: Diagnosis not present

## 2020-09-13 DIAGNOSIS — R21 Rash and other nonspecific skin eruption: Secondary | ICD-10-CM | POA: Diagnosis not present

## 2020-09-16 DIAGNOSIS — M4316 Spondylolisthesis, lumbar region: Secondary | ICD-10-CM | POA: Diagnosis not present

## 2020-09-16 DIAGNOSIS — M4326 Fusion of spine, lumbar region: Secondary | ICD-10-CM | POA: Diagnosis not present

## 2020-09-16 DIAGNOSIS — E7849 Other hyperlipidemia: Secondary | ICD-10-CM | POA: Diagnosis not present

## 2020-09-16 DIAGNOSIS — M170 Bilateral primary osteoarthritis of knee: Secondary | ICD-10-CM | POA: Diagnosis not present

## 2020-09-16 DIAGNOSIS — Z48811 Encounter for surgical aftercare following surgery on the nervous system: Secondary | ICD-10-CM | POA: Diagnosis not present

## 2020-09-16 DIAGNOSIS — E039 Hypothyroidism, unspecified: Secondary | ICD-10-CM | POA: Diagnosis not present

## 2020-09-16 DIAGNOSIS — I10 Essential (primary) hypertension: Secondary | ICD-10-CM | POA: Diagnosis not present

## 2020-09-17 DIAGNOSIS — M4316 Spondylolisthesis, lumbar region: Secondary | ICD-10-CM | POA: Diagnosis not present

## 2020-09-17 DIAGNOSIS — M4326 Fusion of spine, lumbar region: Secondary | ICD-10-CM | POA: Diagnosis not present

## 2020-09-17 DIAGNOSIS — Z48811 Encounter for surgical aftercare following surgery on the nervous system: Secondary | ICD-10-CM | POA: Diagnosis not present

## 2020-09-17 DIAGNOSIS — M170 Bilateral primary osteoarthritis of knee: Secondary | ICD-10-CM | POA: Diagnosis not present

## 2020-09-20 DIAGNOSIS — R69 Illness, unspecified: Secondary | ICD-10-CM | POA: Diagnosis not present

## 2020-09-21 DIAGNOSIS — Z48811 Encounter for surgical aftercare following surgery on the nervous system: Secondary | ICD-10-CM | POA: Diagnosis not present

## 2020-09-21 DIAGNOSIS — M170 Bilateral primary osteoarthritis of knee: Secondary | ICD-10-CM | POA: Diagnosis not present

## 2020-09-21 DIAGNOSIS — M4326 Fusion of spine, lumbar region: Secondary | ICD-10-CM | POA: Diagnosis not present

## 2020-09-21 DIAGNOSIS — M4316 Spondylolisthesis, lumbar region: Secondary | ICD-10-CM | POA: Diagnosis not present

## 2020-09-23 DIAGNOSIS — M4316 Spondylolisthesis, lumbar region: Secondary | ICD-10-CM | POA: Diagnosis not present

## 2020-09-23 DIAGNOSIS — M170 Bilateral primary osteoarthritis of knee: Secondary | ICD-10-CM | POA: Diagnosis not present

## 2020-09-23 DIAGNOSIS — M4326 Fusion of spine, lumbar region: Secondary | ICD-10-CM | POA: Diagnosis not present

## 2020-09-23 DIAGNOSIS — Z48811 Encounter for surgical aftercare following surgery on the nervous system: Secondary | ICD-10-CM | POA: Diagnosis not present

## 2020-09-24 DIAGNOSIS — M4326 Fusion of spine, lumbar region: Secondary | ICD-10-CM | POA: Diagnosis not present

## 2020-09-24 DIAGNOSIS — Z48811 Encounter for surgical aftercare following surgery on the nervous system: Secondary | ICD-10-CM | POA: Diagnosis not present

## 2020-09-24 DIAGNOSIS — M4316 Spondylolisthesis, lumbar region: Secondary | ICD-10-CM | POA: Diagnosis not present

## 2020-09-24 DIAGNOSIS — M170 Bilateral primary osteoarthritis of knee: Secondary | ICD-10-CM | POA: Diagnosis not present

## 2020-09-27 DIAGNOSIS — E785 Hyperlipidemia, unspecified: Secondary | ICD-10-CM | POA: Diagnosis not present

## 2020-09-27 DIAGNOSIS — R944 Abnormal results of kidney function studies: Secondary | ICD-10-CM | POA: Diagnosis not present

## 2020-09-27 DIAGNOSIS — D649 Anemia, unspecified: Secondary | ICD-10-CM | POA: Diagnosis not present

## 2020-09-27 DIAGNOSIS — N289 Disorder of kidney and ureter, unspecified: Secondary | ICD-10-CM | POA: Diagnosis not present

## 2020-09-27 DIAGNOSIS — E039 Hypothyroidism, unspecified: Secondary | ICD-10-CM | POA: Diagnosis not present

## 2020-09-27 DIAGNOSIS — Z23 Encounter for immunization: Secondary | ICD-10-CM | POA: Diagnosis not present

## 2020-09-27 DIAGNOSIS — E119 Type 2 diabetes mellitus without complications: Secondary | ICD-10-CM | POA: Diagnosis not present

## 2020-09-28 DIAGNOSIS — Z48811 Encounter for surgical aftercare following surgery on the nervous system: Secondary | ICD-10-CM | POA: Diagnosis not present

## 2020-09-28 DIAGNOSIS — M4326 Fusion of spine, lumbar region: Secondary | ICD-10-CM | POA: Diagnosis not present

## 2020-09-28 DIAGNOSIS — M4316 Spondylolisthesis, lumbar region: Secondary | ICD-10-CM | POA: Diagnosis not present

## 2020-09-28 DIAGNOSIS — M170 Bilateral primary osteoarthritis of knee: Secondary | ICD-10-CM | POA: Diagnosis not present

## 2020-09-30 DIAGNOSIS — Z48811 Encounter for surgical aftercare following surgery on the nervous system: Secondary | ICD-10-CM | POA: Diagnosis not present

## 2020-09-30 DIAGNOSIS — M4316 Spondylolisthesis, lumbar region: Secondary | ICD-10-CM | POA: Diagnosis not present

## 2020-09-30 DIAGNOSIS — M170 Bilateral primary osteoarthritis of knee: Secondary | ICD-10-CM | POA: Diagnosis not present

## 2020-09-30 DIAGNOSIS — M4326 Fusion of spine, lumbar region: Secondary | ICD-10-CM | POA: Diagnosis not present

## 2020-10-01 DIAGNOSIS — M4316 Spondylolisthesis, lumbar region: Secondary | ICD-10-CM | POA: Diagnosis not present

## 2020-10-01 DIAGNOSIS — M170 Bilateral primary osteoarthritis of knee: Secondary | ICD-10-CM | POA: Diagnosis not present

## 2020-10-01 DIAGNOSIS — Z48811 Encounter for surgical aftercare following surgery on the nervous system: Secondary | ICD-10-CM | POA: Diagnosis not present

## 2020-10-01 DIAGNOSIS — M4326 Fusion of spine, lumbar region: Secondary | ICD-10-CM | POA: Diagnosis not present

## 2020-10-04 DIAGNOSIS — R69 Illness, unspecified: Secondary | ICD-10-CM | POA: Diagnosis not present

## 2020-10-08 DIAGNOSIS — M4316 Spondylolisthesis, lumbar region: Secondary | ICD-10-CM | POA: Diagnosis not present

## 2020-10-08 DIAGNOSIS — M4326 Fusion of spine, lumbar region: Secondary | ICD-10-CM | POA: Diagnosis not present

## 2020-10-08 DIAGNOSIS — Z48811 Encounter for surgical aftercare following surgery on the nervous system: Secondary | ICD-10-CM | POA: Diagnosis not present

## 2020-10-08 DIAGNOSIS — M170 Bilateral primary osteoarthritis of knee: Secondary | ICD-10-CM | POA: Diagnosis not present

## 2020-10-11 DIAGNOSIS — E119 Type 2 diabetes mellitus without complications: Secondary | ICD-10-CM | POA: Diagnosis not present

## 2020-10-11 DIAGNOSIS — Z713 Dietary counseling and surveillance: Secondary | ICD-10-CM | POA: Diagnosis not present

## 2020-10-12 DIAGNOSIS — Z23 Encounter for immunization: Secondary | ICD-10-CM | POA: Diagnosis not present

## 2020-10-22 DIAGNOSIS — M6281 Muscle weakness (generalized): Secondary | ICD-10-CM | POA: Diagnosis not present

## 2020-10-22 DIAGNOSIS — M4316 Spondylolisthesis, lumbar region: Secondary | ICD-10-CM | POA: Diagnosis not present

## 2020-10-22 DIAGNOSIS — Z9181 History of falling: Secondary | ICD-10-CM | POA: Diagnosis not present

## 2020-10-22 DIAGNOSIS — R262 Difficulty in walking, not elsewhere classified: Secondary | ICD-10-CM | POA: Diagnosis not present

## 2020-10-22 DIAGNOSIS — I10 Essential (primary) hypertension: Secondary | ICD-10-CM | POA: Diagnosis not present

## 2020-10-22 DIAGNOSIS — M4326 Fusion of spine, lumbar region: Secondary | ICD-10-CM | POA: Diagnosis not present

## 2020-10-22 DIAGNOSIS — M545 Low back pain, unspecified: Secondary | ICD-10-CM | POA: Diagnosis not present

## 2020-10-22 DIAGNOSIS — F419 Anxiety disorder, unspecified: Secondary | ICD-10-CM | POA: Diagnosis not present

## 2020-10-22 DIAGNOSIS — M150 Primary generalized (osteo)arthritis: Secondary | ICD-10-CM | POA: Diagnosis not present

## 2020-10-22 DIAGNOSIS — R41841 Cognitive communication deficit: Secondary | ICD-10-CM | POA: Diagnosis not present

## 2020-10-25 DIAGNOSIS — M150 Primary generalized (osteo)arthritis: Secondary | ICD-10-CM | POA: Diagnosis not present

## 2020-10-25 DIAGNOSIS — M4326 Fusion of spine, lumbar region: Secondary | ICD-10-CM | POA: Diagnosis not present

## 2020-10-25 DIAGNOSIS — I10 Essential (primary) hypertension: Secondary | ICD-10-CM | POA: Diagnosis not present

## 2020-10-25 DIAGNOSIS — F419 Anxiety disorder, unspecified: Secondary | ICD-10-CM | POA: Diagnosis not present

## 2020-10-25 DIAGNOSIS — M4316 Spondylolisthesis, lumbar region: Secondary | ICD-10-CM | POA: Diagnosis not present

## 2020-10-26 DIAGNOSIS — Z0000 Encounter for general adult medical examination without abnormal findings: Secondary | ICD-10-CM

## 2020-10-26 DIAGNOSIS — M4316 Spondylolisthesis, lumbar region: Secondary | ICD-10-CM | POA: Diagnosis not present

## 2020-10-26 DIAGNOSIS — D649 Anemia, unspecified: Secondary | ICD-10-CM | POA: Diagnosis not present

## 2020-10-26 DIAGNOSIS — I10 Essential (primary) hypertension: Secondary | ICD-10-CM | POA: Diagnosis not present

## 2020-10-26 DIAGNOSIS — M4326 Fusion of spine, lumbar region: Secondary | ICD-10-CM | POA: Diagnosis not present

## 2020-10-26 DIAGNOSIS — M150 Primary generalized (osteo)arthritis: Secondary | ICD-10-CM | POA: Diagnosis not present

## 2020-10-26 DIAGNOSIS — E7800 Pure hypercholesterolemia, unspecified: Secondary | ICD-10-CM | POA: Diagnosis not present

## 2020-10-26 DIAGNOSIS — E039 Hypothyroidism, unspecified: Secondary | ICD-10-CM | POA: Diagnosis not present

## 2020-10-26 DIAGNOSIS — F419 Anxiety disorder, unspecified: Secondary | ICD-10-CM | POA: Diagnosis not present

## 2020-10-27 ENCOUNTER — Inpatient Hospital Stay: Admit: 2020-10-27 | Payer: PRIVATE HEALTH INSURANCE

## 2020-10-27 LAB — COMPREHENSIVE METABOLIC PANEL
ALT: 24 U/L (ref 12–78)
AST: 26 U/L (ref 15–37)
Albumin: 3.3 gm/dl — ABNORMAL LOW (ref 3.4–5.0)
Alkaline Phosphatase: 71 U/L (ref 45–117)
Anion Gap: 5 mmol/L (ref 5–15)
BUN: 21 mg/dl (ref 7–25)
CO2: 28 mEq/L (ref 21–32)
Calcium: 9.6 mg/dl (ref 8.5–10.1)
Chloride: 104 mEq/L (ref 98–107)
Creatinine: 1.4 mg/dl — ABNORMAL HIGH (ref 0.6–1.3)
EGFR IF NonAfrican American: 39
GFR African American: 47
Glucose: 130 mg/dl — ABNORMAL HIGH (ref 74–106)
Potassium: 3.5 mEq/L (ref 3.5–5.1)
Sodium: 138 mEq/L (ref 136–145)
Total Bilirubin: 0.9 mg/dl (ref 0.2–1.0)
Total Protein: 7.3 gm/dl (ref 6.4–8.2)

## 2020-10-27 LAB — CBC WITH AUTO DIFFERENTIAL
Basophils %: 0.8 % (ref 0–3)
Eosinophils %: 1.5 % (ref 0–5)
Hematocrit: 27 % — ABNORMAL LOW (ref 37.0–50.0)
Hemoglobin: 8.9 gm/dl — ABNORMAL LOW (ref 13.0–17.2)
Immature Granulocytes: 0.8 % (ref 0.0–3.0)
Lymphocytes %: 55 % — ABNORMAL HIGH (ref 28–48)
MCH: 31.7 pg (ref 25.4–34.6)
MCHC: 33 gm/dl (ref 30.0–36.0)
MCV: 96.1 fL (ref 80.0–98.0)
MPV: 11 fL — ABNORMAL HIGH (ref 6.0–10.0)
Monocytes %: 4.6 % (ref 1–13)
Neutrophils %: 37.3 % (ref 34–64)
Nucleated RBCs: 0 (ref 0–0)
Platelet Comment: DECREASED
Platelets: 44 10*3/uL — CL (ref 140–450)
RBC: 2.81 M/uL — ABNORMAL LOW (ref 3.60–5.20)
RDW-SD: 65.3 — ABNORMAL HIGH (ref 36.4–46.3)
WBC: 7.8 10*3/uL (ref 4.0–11.0)

## 2020-10-27 LAB — HEMOGLOBIN A1C W/O EAG
Hemoglobin A1C: 6 % — ABNORMAL HIGH (ref 4.2–5.6)
Hemoglobin A1c: 6 % — ABNORMAL HIGH (ref 4.2–5.6)

## 2020-10-27 LAB — TSH 3RD GENERATION
TSH: 0.098 u[IU]/mL — ABNORMAL LOW (ref 0.358–3.740)
TSH: 0.098 u[IU]/mL — ABNORMAL LOW (ref 0.358–3.740)

## 2020-10-27 LAB — VITAMIN D 25 HYDROXY: Vitamin D, 25-OH, Total: 43.3 ng/ml (ref 30.0–100.0)

## 2020-10-27 LAB — METABOLIC PANEL, COMPREHENSIVE
ALT (SGPT): 24 U/L (ref 12–78)
AST (SGOT): 26 U/L (ref 15–37)
Albumin: 3.3 gm/dl — ABNORMAL LOW (ref 3.4–5.0)
Alk. phosphatase: 71 U/L (ref 45–117)
Anion gap: 5 mmol/L (ref 5–15)
BUN: 21 mg/dl (ref 7–25)
Bilirubin, total: 0.9 mg/dl (ref 0.2–1.0)
CO2: 28 mEq/L (ref 21–32)
Calcium: 9.6 mg/dl (ref 8.5–10.1)
Chloride: 104 mEq/L (ref 98–107)
Creatinine: 1.4 mg/dl — ABNORMAL HIGH (ref 0.6–1.3)
GFR est AA: 47
GFR est non-AA: 39
Glucose: 130 mg/dl — ABNORMAL HIGH (ref 74–106)
Potassium: 3.5 mEq/L (ref 3.5–5.1)
Protein, total: 7.3 gm/dl (ref 6.4–8.2)
Sodium: 138 mEq/L (ref 136–145)

## 2020-10-27 LAB — CBC WITH AUTOMATED DIFF
BASOPHILS: 0.8 % (ref 0–3)
EOSINOPHILS: 1.5 % (ref 0–5)
HCT: 27 % — ABNORMAL LOW (ref 37.0–50.0)
HGB: 8.9 gm/dl — ABNORMAL LOW (ref 13.0–17.2)
IMMATURE GRANULOCYTES: 0.8 % (ref 0.0–3.0)
LYMPHOCYTES: 55 % — ABNORMAL HIGH (ref 28–48)
MCH: 31.7 pg (ref 25.4–34.6)
MCHC: 33 gm/dl (ref 30.0–36.0)
MCV: 96.1 fL (ref 80.0–98.0)
MONOCYTES: 4.6 % (ref 1–13)
MPV: 11 fL — ABNORMAL HIGH (ref 6.0–10.0)
NEUTROPHILS: 37.3 % (ref 34–64)
NRBC: 0 (ref 0–0)
PLATELET COMMENTS: DECREASED
PLATELET: 44 10*3/uL — CL (ref 140–450)
RBC: 2.81 M/uL — ABNORMAL LOW (ref 3.60–5.20)
RDW-SD: 65.3 — ABNORMAL HIGH (ref 36.4–46.3)
WBC: 7.8 10*3/uL (ref 4.0–11.0)

## 2020-10-27 LAB — VITAMIN D, 25 HYDROXY: Vitamin D, 25-OH, Total: 43.3 ng/ml (ref 30.0–100.0)

## 2020-10-28 DIAGNOSIS — M150 Primary generalized (osteo)arthritis: Secondary | ICD-10-CM | POA: Diagnosis not present

## 2020-10-28 DIAGNOSIS — M4316 Spondylolisthesis, lumbar region: Secondary | ICD-10-CM | POA: Diagnosis not present

## 2020-10-28 DIAGNOSIS — F419 Anxiety disorder, unspecified: Secondary | ICD-10-CM | POA: Diagnosis not present

## 2020-10-28 DIAGNOSIS — M4326 Fusion of spine, lumbar region: Secondary | ICD-10-CM | POA: Diagnosis not present

## 2020-10-28 DIAGNOSIS — I10 Essential (primary) hypertension: Secondary | ICD-10-CM | POA: Diagnosis not present

## 2020-10-28 LAB — LIPID PANEL
CHOL/HDL Ratio: 2.3 Ratio (ref 0.0–4.4)
Chol/HDL Ratio: 2.3 Ratio (ref 0.0–4.4)
Cholesterol, Total: 88 mg/dl — ABNORMAL LOW (ref 140–199)
Cholesterol, total: 88 mg/dl — ABNORMAL LOW (ref 140–199)
HDL Cholesterol: 38 mg/dl — ABNORMAL LOW (ref 40–96)
HDL: 38 mg/dl — ABNORMAL LOW (ref 40–96)
LDL Calculated: 9 mg/dl (ref 0–130)
LDL, calculated: 9 mg/dl (ref 0–130)
Triglyceride: 206 mg/dl — ABNORMAL HIGH (ref 29–150)
Triglycerides: 206 mg/dl — ABNORMAL HIGH (ref 29–150)

## 2020-10-29 DIAGNOSIS — M4326 Fusion of spine, lumbar region: Secondary | ICD-10-CM | POA: Diagnosis not present

## 2020-10-29 DIAGNOSIS — F419 Anxiety disorder, unspecified: Secondary | ICD-10-CM | POA: Diagnosis not present

## 2020-10-29 DIAGNOSIS — M150 Primary generalized (osteo)arthritis: Secondary | ICD-10-CM | POA: Diagnosis not present

## 2020-10-29 DIAGNOSIS — M4316 Spondylolisthesis, lumbar region: Secondary | ICD-10-CM | POA: Diagnosis not present

## 2020-10-29 DIAGNOSIS — I10 Essential (primary) hypertension: Secondary | ICD-10-CM | POA: Diagnosis not present

## 2020-11-01 DIAGNOSIS — M4326 Fusion of spine, lumbar region: Secondary | ICD-10-CM | POA: Diagnosis not present

## 2020-11-01 DIAGNOSIS — M150 Primary generalized (osteo)arthritis: Secondary | ICD-10-CM | POA: Diagnosis not present

## 2020-11-01 DIAGNOSIS — F419 Anxiety disorder, unspecified: Secondary | ICD-10-CM | POA: Diagnosis not present

## 2020-11-01 DIAGNOSIS — I10 Essential (primary) hypertension: Secondary | ICD-10-CM | POA: Diagnosis not present

## 2020-11-01 DIAGNOSIS — M4316 Spondylolisthesis, lumbar region: Secondary | ICD-10-CM | POA: Diagnosis not present

## 2020-11-02 DIAGNOSIS — I10 Essential (primary) hypertension: Secondary | ICD-10-CM | POA: Diagnosis not present

## 2020-11-02 DIAGNOSIS — F419 Anxiety disorder, unspecified: Secondary | ICD-10-CM | POA: Diagnosis not present

## 2020-11-02 DIAGNOSIS — M4316 Spondylolisthesis, lumbar region: Secondary | ICD-10-CM | POA: Diagnosis not present

## 2020-11-02 DIAGNOSIS — M150 Primary generalized (osteo)arthritis: Secondary | ICD-10-CM | POA: Diagnosis not present

## 2020-11-02 DIAGNOSIS — M4326 Fusion of spine, lumbar region: Secondary | ICD-10-CM | POA: Diagnosis not present

## 2020-11-03 DIAGNOSIS — M150 Primary generalized (osteo)arthritis: Secondary | ICD-10-CM | POA: Diagnosis not present

## 2020-11-03 DIAGNOSIS — M4326 Fusion of spine, lumbar region: Secondary | ICD-10-CM | POA: Diagnosis not present

## 2020-11-03 DIAGNOSIS — F419 Anxiety disorder, unspecified: Secondary | ICD-10-CM | POA: Diagnosis not present

## 2020-11-03 DIAGNOSIS — M4316 Spondylolisthesis, lumbar region: Secondary | ICD-10-CM | POA: Diagnosis not present

## 2020-11-03 DIAGNOSIS — I10 Essential (primary) hypertension: Secondary | ICD-10-CM | POA: Diagnosis not present

## 2020-11-04 DIAGNOSIS — I10 Essential (primary) hypertension: Secondary | ICD-10-CM | POA: Diagnosis not present

## 2020-11-04 DIAGNOSIS — F419 Anxiety disorder, unspecified: Secondary | ICD-10-CM | POA: Diagnosis not present

## 2020-11-04 DIAGNOSIS — M4326 Fusion of spine, lumbar region: Secondary | ICD-10-CM | POA: Diagnosis not present

## 2020-11-04 DIAGNOSIS — M4316 Spondylolisthesis, lumbar region: Secondary | ICD-10-CM | POA: Diagnosis not present

## 2020-11-04 DIAGNOSIS — M150 Primary generalized (osteo)arthritis: Secondary | ICD-10-CM | POA: Diagnosis not present

## 2020-11-05 DIAGNOSIS — M150 Primary generalized (osteo)arthritis: Secondary | ICD-10-CM | POA: Diagnosis not present

## 2020-11-05 DIAGNOSIS — M4316 Spondylolisthesis, lumbar region: Secondary | ICD-10-CM | POA: Diagnosis not present

## 2020-11-05 DIAGNOSIS — I10 Essential (primary) hypertension: Secondary | ICD-10-CM | POA: Diagnosis not present

## 2020-11-05 DIAGNOSIS — M4326 Fusion of spine, lumbar region: Secondary | ICD-10-CM | POA: Diagnosis not present

## 2020-11-05 DIAGNOSIS — F419 Anxiety disorder, unspecified: Secondary | ICD-10-CM | POA: Diagnosis not present

## 2020-11-07 DIAGNOSIS — F419 Anxiety disorder, unspecified: Secondary | ICD-10-CM | POA: Diagnosis not present

## 2020-11-07 DIAGNOSIS — I10 Essential (primary) hypertension: Secondary | ICD-10-CM | POA: Diagnosis not present

## 2020-11-07 DIAGNOSIS — M4316 Spondylolisthesis, lumbar region: Secondary | ICD-10-CM | POA: Diagnosis not present

## 2020-11-07 DIAGNOSIS — M150 Primary generalized (osteo)arthritis: Secondary | ICD-10-CM | POA: Diagnosis not present

## 2020-11-07 DIAGNOSIS — M4326 Fusion of spine, lumbar region: Secondary | ICD-10-CM | POA: Diagnosis not present

## 2020-11-08 DIAGNOSIS — M150 Primary generalized (osteo)arthritis: Secondary | ICD-10-CM | POA: Diagnosis not present

## 2020-11-08 DIAGNOSIS — I10 Essential (primary) hypertension: Secondary | ICD-10-CM | POA: Diagnosis not present

## 2020-11-08 DIAGNOSIS — F419 Anxiety disorder, unspecified: Secondary | ICD-10-CM | POA: Diagnosis not present

## 2020-11-08 DIAGNOSIS — M4326 Fusion of spine, lumbar region: Secondary | ICD-10-CM | POA: Diagnosis not present

## 2020-11-08 DIAGNOSIS — M4316 Spondylolisthesis, lumbar region: Secondary | ICD-10-CM | POA: Diagnosis not present

## 2020-11-09 DIAGNOSIS — I10 Essential (primary) hypertension: Secondary | ICD-10-CM | POA: Diagnosis not present

## 2020-11-09 DIAGNOSIS — F419 Anxiety disorder, unspecified: Secondary | ICD-10-CM | POA: Diagnosis not present

## 2020-11-09 DIAGNOSIS — M4326 Fusion of spine, lumbar region: Secondary | ICD-10-CM | POA: Diagnosis not present

## 2020-11-09 DIAGNOSIS — M4316 Spondylolisthesis, lumbar region: Secondary | ICD-10-CM | POA: Diagnosis not present

## 2020-11-09 DIAGNOSIS — M150 Primary generalized (osteo)arthritis: Secondary | ICD-10-CM | POA: Diagnosis not present

## 2020-11-10 DIAGNOSIS — F419 Anxiety disorder, unspecified: Secondary | ICD-10-CM | POA: Diagnosis not present

## 2020-11-10 DIAGNOSIS — M4326 Fusion of spine, lumbar region: Secondary | ICD-10-CM | POA: Diagnosis not present

## 2020-11-10 DIAGNOSIS — M4316 Spondylolisthesis, lumbar region: Secondary | ICD-10-CM | POA: Diagnosis not present

## 2020-11-10 DIAGNOSIS — I10 Essential (primary) hypertension: Secondary | ICD-10-CM | POA: Diagnosis not present

## 2020-11-10 DIAGNOSIS — M150 Primary generalized (osteo)arthritis: Secondary | ICD-10-CM | POA: Diagnosis not present

## 2020-11-15 DIAGNOSIS — F419 Anxiety disorder, unspecified: Secondary | ICD-10-CM | POA: Diagnosis not present

## 2020-11-15 DIAGNOSIS — M4316 Spondylolisthesis, lumbar region: Secondary | ICD-10-CM | POA: Diagnosis not present

## 2020-11-15 DIAGNOSIS — I10 Essential (primary) hypertension: Secondary | ICD-10-CM | POA: Diagnosis not present

## 2020-11-15 DIAGNOSIS — M4326 Fusion of spine, lumbar region: Secondary | ICD-10-CM | POA: Diagnosis not present

## 2020-11-15 DIAGNOSIS — M150 Primary generalized (osteo)arthritis: Secondary | ICD-10-CM | POA: Diagnosis not present

## 2020-11-16 DIAGNOSIS — F419 Anxiety disorder, unspecified: Secondary | ICD-10-CM | POA: Diagnosis not present

## 2020-11-16 DIAGNOSIS — M4326 Fusion of spine, lumbar region: Secondary | ICD-10-CM | POA: Diagnosis not present

## 2020-11-16 DIAGNOSIS — M150 Primary generalized (osteo)arthritis: Secondary | ICD-10-CM | POA: Diagnosis not present

## 2020-11-16 DIAGNOSIS — I10 Essential (primary) hypertension: Secondary | ICD-10-CM | POA: Diagnosis not present

## 2020-11-16 DIAGNOSIS — M4316 Spondylolisthesis, lumbar region: Secondary | ICD-10-CM | POA: Diagnosis not present

## 2020-11-17 DIAGNOSIS — M4326 Fusion of spine, lumbar region: Secondary | ICD-10-CM | POA: Diagnosis not present

## 2020-11-17 DIAGNOSIS — F419 Anxiety disorder, unspecified: Secondary | ICD-10-CM | POA: Diagnosis not present

## 2020-11-17 DIAGNOSIS — M545 Low back pain, unspecified: Secondary | ICD-10-CM | POA: Diagnosis not present

## 2020-11-17 DIAGNOSIS — M4316 Spondylolisthesis, lumbar region: Secondary | ICD-10-CM | POA: Diagnosis not present

## 2020-11-17 DIAGNOSIS — R41841 Cognitive communication deficit: Secondary | ICD-10-CM | POA: Diagnosis not present

## 2020-11-17 DIAGNOSIS — R262 Difficulty in walking, not elsewhere classified: Secondary | ICD-10-CM | POA: Diagnosis not present

## 2020-11-17 DIAGNOSIS — Z9181 History of falling: Secondary | ICD-10-CM | POA: Diagnosis not present

## 2020-11-17 DIAGNOSIS — I10 Essential (primary) hypertension: Secondary | ICD-10-CM | POA: Diagnosis not present

## 2020-11-17 DIAGNOSIS — M150 Primary generalized (osteo)arthritis: Secondary | ICD-10-CM | POA: Diagnosis not present

## 2020-11-17 DIAGNOSIS — M6281 Muscle weakness (generalized): Secondary | ICD-10-CM | POA: Diagnosis not present

## 2020-11-18 DIAGNOSIS — M4316 Spondylolisthesis, lumbar region: Secondary | ICD-10-CM | POA: Diagnosis not present

## 2020-11-18 DIAGNOSIS — M150 Primary generalized (osteo)arthritis: Secondary | ICD-10-CM | POA: Diagnosis not present

## 2020-11-18 DIAGNOSIS — I10 Essential (primary) hypertension: Secondary | ICD-10-CM | POA: Diagnosis not present

## 2020-11-18 DIAGNOSIS — F419 Anxiety disorder, unspecified: Secondary | ICD-10-CM | POA: Diagnosis not present

## 2020-11-18 DIAGNOSIS — M4326 Fusion of spine, lumbar region: Secondary | ICD-10-CM | POA: Diagnosis not present

## 2020-11-19 DIAGNOSIS — M150 Primary generalized (osteo)arthritis: Secondary | ICD-10-CM | POA: Diagnosis not present

## 2020-11-19 DIAGNOSIS — M4316 Spondylolisthesis, lumbar region: Secondary | ICD-10-CM | POA: Diagnosis not present

## 2020-11-19 DIAGNOSIS — F419 Anxiety disorder, unspecified: Secondary | ICD-10-CM | POA: Diagnosis not present

## 2020-11-19 DIAGNOSIS — I10 Essential (primary) hypertension: Secondary | ICD-10-CM | POA: Diagnosis not present

## 2020-11-19 DIAGNOSIS — M4326 Fusion of spine, lumbar region: Secondary | ICD-10-CM | POA: Diagnosis not present

## 2020-11-19 DIAGNOSIS — R9341 Abnormal radiologic findings on diagnostic imaging of renal pelvis, ureter, or bladder: Secondary | ICD-10-CM | POA: Diagnosis not present

## 2020-11-19 DIAGNOSIS — Z87442 Personal history of urinary calculi: Secondary | ICD-10-CM | POA: Diagnosis not present

## 2020-11-19 DIAGNOSIS — R1033 Periumbilical pain: Secondary | ICD-10-CM | POA: Diagnosis not present

## 2020-11-19 DIAGNOSIS — N281 Cyst of kidney, acquired: Secondary | ICD-10-CM | POA: Diagnosis not present

## 2020-11-22 DIAGNOSIS — F419 Anxiety disorder, unspecified: Secondary | ICD-10-CM | POA: Diagnosis not present

## 2020-11-22 DIAGNOSIS — M4316 Spondylolisthesis, lumbar region: Secondary | ICD-10-CM | POA: Diagnosis not present

## 2020-11-22 DIAGNOSIS — M4326 Fusion of spine, lumbar region: Secondary | ICD-10-CM | POA: Diagnosis not present

## 2020-11-22 DIAGNOSIS — I10 Essential (primary) hypertension: Secondary | ICD-10-CM | POA: Diagnosis not present

## 2020-11-22 DIAGNOSIS — M150 Primary generalized (osteo)arthritis: Secondary | ICD-10-CM | POA: Diagnosis not present

## 2020-11-24 DIAGNOSIS — E039 Hypothyroidism, unspecified: Secondary | ICD-10-CM

## 2020-11-24 DIAGNOSIS — M4326 Fusion of spine, lumbar region: Secondary | ICD-10-CM | POA: Diagnosis not present

## 2020-11-24 DIAGNOSIS — M4316 Spondylolisthesis, lumbar region: Secondary | ICD-10-CM | POA: Diagnosis not present

## 2020-11-24 DIAGNOSIS — M150 Primary generalized (osteo)arthritis: Secondary | ICD-10-CM | POA: Diagnosis not present

## 2020-11-24 DIAGNOSIS — F419 Anxiety disorder, unspecified: Secondary | ICD-10-CM | POA: Diagnosis not present

## 2020-11-24 DIAGNOSIS — I10 Essential (primary) hypertension: Secondary | ICD-10-CM | POA: Diagnosis not present

## 2020-11-24 DIAGNOSIS — D649 Anemia, unspecified: Secondary | ICD-10-CM | POA: Diagnosis not present

## 2020-11-25 ENCOUNTER — Inpatient Hospital Stay: Admit: 2020-11-25 | Payer: PRIVATE HEALTH INSURANCE

## 2020-11-25 DIAGNOSIS — M4326 Fusion of spine, lumbar region: Secondary | ICD-10-CM | POA: Diagnosis not present

## 2020-11-25 DIAGNOSIS — M4316 Spondylolisthesis, lumbar region: Secondary | ICD-10-CM | POA: Diagnosis not present

## 2020-11-25 DIAGNOSIS — M150 Primary generalized (osteo)arthritis: Secondary | ICD-10-CM | POA: Diagnosis not present

## 2020-11-25 DIAGNOSIS — I10 Essential (primary) hypertension: Secondary | ICD-10-CM | POA: Diagnosis not present

## 2020-11-25 DIAGNOSIS — F419 Anxiety disorder, unspecified: Secondary | ICD-10-CM | POA: Diagnosis not present

## 2020-11-25 LAB — IRON
Iron: 73 ug/dL (ref 50–170)
Iron: 73 ug/dL (ref 50–170)

## 2020-11-25 LAB — TSH 3RD GENERATION
TSH: 1.16 u[IU]/mL (ref 0.358–3.740)
TSH: 1.16 u[IU]/mL (ref 0.358–3.740)

## 2020-11-25 LAB — LIPID PANEL
CHOL/HDL Ratio: 2.5 Ratio (ref 0.0–4.4)
Chol/HDL Ratio: 2.5 Ratio (ref 0.0–4.4)
Cholesterol, Total: 91 mg/dl — ABNORMAL LOW (ref 140–199)
Cholesterol, total: 91 mg/dl — ABNORMAL LOW (ref 140–199)
HDL Cholesterol: 37 mg/dl — ABNORMAL LOW (ref 40–96)
HDL: 37 mg/dl — ABNORMAL LOW (ref 40–96)
LDL Calculated: 25 mg/dl (ref 0–130)
LDL, calculated: 25 mg/dl (ref 0–130)
Triglyceride: 143 mg/dl (ref 29–150)
Triglycerides: 143 mg/dl (ref 29–150)

## 2020-11-26 DIAGNOSIS — F419 Anxiety disorder, unspecified: Secondary | ICD-10-CM | POA: Diagnosis not present

## 2020-11-26 DIAGNOSIS — M150 Primary generalized (osteo)arthritis: Secondary | ICD-10-CM | POA: Diagnosis not present

## 2020-11-26 DIAGNOSIS — M4326 Fusion of spine, lumbar region: Secondary | ICD-10-CM | POA: Diagnosis not present

## 2020-11-26 DIAGNOSIS — M4316 Spondylolisthesis, lumbar region: Secondary | ICD-10-CM | POA: Diagnosis not present

## 2020-11-26 DIAGNOSIS — I10 Essential (primary) hypertension: Secondary | ICD-10-CM | POA: Diagnosis not present

## 2020-11-29 DIAGNOSIS — M4316 Spondylolisthesis, lumbar region: Secondary | ICD-10-CM | POA: Diagnosis not present

## 2020-11-29 DIAGNOSIS — M150 Primary generalized (osteo)arthritis: Secondary | ICD-10-CM | POA: Diagnosis not present

## 2020-11-29 DIAGNOSIS — F419 Anxiety disorder, unspecified: Secondary | ICD-10-CM | POA: Diagnosis not present

## 2020-11-29 DIAGNOSIS — M4326 Fusion of spine, lumbar region: Secondary | ICD-10-CM | POA: Diagnosis not present

## 2020-11-29 DIAGNOSIS — I10 Essential (primary) hypertension: Secondary | ICD-10-CM | POA: Diagnosis not present

## 2020-11-30 DIAGNOSIS — M150 Primary generalized (osteo)arthritis: Secondary | ICD-10-CM | POA: Diagnosis not present

## 2020-11-30 DIAGNOSIS — I10 Essential (primary) hypertension: Secondary | ICD-10-CM | POA: Diagnosis not present

## 2020-11-30 DIAGNOSIS — M4316 Spondylolisthesis, lumbar region: Secondary | ICD-10-CM | POA: Diagnosis not present

## 2020-11-30 DIAGNOSIS — M4326 Fusion of spine, lumbar region: Secondary | ICD-10-CM | POA: Diagnosis not present

## 2020-11-30 DIAGNOSIS — F419 Anxiety disorder, unspecified: Secondary | ICD-10-CM | POA: Diagnosis not present

## 2020-12-02 DIAGNOSIS — F419 Anxiety disorder, unspecified: Secondary | ICD-10-CM | POA: Diagnosis not present

## 2020-12-02 DIAGNOSIS — I10 Essential (primary) hypertension: Secondary | ICD-10-CM | POA: Diagnosis not present

## 2020-12-02 DIAGNOSIS — M4326 Fusion of spine, lumbar region: Secondary | ICD-10-CM | POA: Diagnosis not present

## 2020-12-02 DIAGNOSIS — M150 Primary generalized (osteo)arthritis: Secondary | ICD-10-CM | POA: Diagnosis not present

## 2020-12-02 DIAGNOSIS — M4316 Spondylolisthesis, lumbar region: Secondary | ICD-10-CM | POA: Diagnosis not present

## 2020-12-06 DIAGNOSIS — I10 Essential (primary) hypertension: Secondary | ICD-10-CM | POA: Diagnosis not present

## 2020-12-06 DIAGNOSIS — M150 Primary generalized (osteo)arthritis: Secondary | ICD-10-CM | POA: Diagnosis not present

## 2020-12-06 DIAGNOSIS — F419 Anxiety disorder, unspecified: Secondary | ICD-10-CM | POA: Diagnosis not present

## 2020-12-06 DIAGNOSIS — M4326 Fusion of spine, lumbar region: Secondary | ICD-10-CM | POA: Diagnosis not present

## 2020-12-06 DIAGNOSIS — M4316 Spondylolisthesis, lumbar region: Secondary | ICD-10-CM | POA: Diagnosis not present

## 2020-12-07 DIAGNOSIS — F419 Anxiety disorder, unspecified: Secondary | ICD-10-CM | POA: Diagnosis not present

## 2020-12-07 DIAGNOSIS — I10 Essential (primary) hypertension: Secondary | ICD-10-CM | POA: Diagnosis not present

## 2020-12-07 DIAGNOSIS — M4316 Spondylolisthesis, lumbar region: Secondary | ICD-10-CM | POA: Diagnosis not present

## 2020-12-07 DIAGNOSIS — M4326 Fusion of spine, lumbar region: Secondary | ICD-10-CM | POA: Diagnosis not present

## 2020-12-07 DIAGNOSIS — M150 Primary generalized (osteo)arthritis: Secondary | ICD-10-CM | POA: Diagnosis not present

## 2020-12-08 DIAGNOSIS — M4316 Spondylolisthesis, lumbar region: Secondary | ICD-10-CM | POA: Diagnosis not present

## 2020-12-08 DIAGNOSIS — M150 Primary generalized (osteo)arthritis: Secondary | ICD-10-CM | POA: Diagnosis not present

## 2020-12-08 DIAGNOSIS — M4326 Fusion of spine, lumbar region: Secondary | ICD-10-CM | POA: Diagnosis not present

## 2020-12-08 DIAGNOSIS — F419 Anxiety disorder, unspecified: Secondary | ICD-10-CM | POA: Diagnosis not present

## 2020-12-08 DIAGNOSIS — I10 Essential (primary) hypertension: Secondary | ICD-10-CM | POA: Diagnosis not present

## 2020-12-09 DIAGNOSIS — M150 Primary generalized (osteo)arthritis: Secondary | ICD-10-CM | POA: Diagnosis not present

## 2020-12-09 DIAGNOSIS — I10 Essential (primary) hypertension: Secondary | ICD-10-CM | POA: Diagnosis not present

## 2020-12-09 DIAGNOSIS — F419 Anxiety disorder, unspecified: Secondary | ICD-10-CM | POA: Diagnosis not present

## 2020-12-09 DIAGNOSIS — M4326 Fusion of spine, lumbar region: Secondary | ICD-10-CM | POA: Diagnosis not present

## 2020-12-09 DIAGNOSIS — M4316 Spondylolisthesis, lumbar region: Secondary | ICD-10-CM | POA: Diagnosis not present

## 2020-12-15 DIAGNOSIS — I10 Essential (primary) hypertension: Secondary | ICD-10-CM | POA: Diagnosis not present

## 2020-12-15 DIAGNOSIS — M4326 Fusion of spine, lumbar region: Secondary | ICD-10-CM | POA: Diagnosis not present

## 2020-12-15 DIAGNOSIS — M150 Primary generalized (osteo)arthritis: Secondary | ICD-10-CM | POA: Diagnosis not present

## 2020-12-15 DIAGNOSIS — M4316 Spondylolisthesis, lumbar region: Secondary | ICD-10-CM | POA: Diagnosis not present

## 2020-12-15 DIAGNOSIS — F419 Anxiety disorder, unspecified: Secondary | ICD-10-CM | POA: Diagnosis not present

## 2020-12-16 DIAGNOSIS — F419 Anxiety disorder, unspecified: Secondary | ICD-10-CM | POA: Diagnosis not present

## 2020-12-16 DIAGNOSIS — M4316 Spondylolisthesis, lumbar region: Secondary | ICD-10-CM | POA: Diagnosis not present

## 2020-12-16 DIAGNOSIS — I10 Essential (primary) hypertension: Secondary | ICD-10-CM | POA: Diagnosis not present

## 2020-12-16 DIAGNOSIS — M150 Primary generalized (osteo)arthritis: Secondary | ICD-10-CM | POA: Diagnosis not present

## 2020-12-16 DIAGNOSIS — M4326 Fusion of spine, lumbar region: Secondary | ICD-10-CM | POA: Diagnosis not present

## 2020-12-17 DIAGNOSIS — I10 Essential (primary) hypertension: Secondary | ICD-10-CM | POA: Diagnosis not present

## 2020-12-17 DIAGNOSIS — M4326 Fusion of spine, lumbar region: Secondary | ICD-10-CM | POA: Diagnosis not present

## 2020-12-17 DIAGNOSIS — M150 Primary generalized (osteo)arthritis: Secondary | ICD-10-CM | POA: Diagnosis not present

## 2020-12-17 DIAGNOSIS — M4316 Spondylolisthesis, lumbar region: Secondary | ICD-10-CM | POA: Diagnosis not present

## 2020-12-17 DIAGNOSIS — F419 Anxiety disorder, unspecified: Secondary | ICD-10-CM | POA: Diagnosis not present

## 2021-04-30 IMAGING — MR MR LUMBAR SPINE W/O CM
6 series · 47 of 48 positions shown · non-contrast
Comparison: None.

CLINICAL DATA: Low back pain for over 6 weeks which extends into
the left leg with tingling

EXAM:
MRI LUMBAR SPINE WITHOUT CONTRAST
TECHNIQUE: Multiplanar, multisequence MR imaging of the lumbar spine was
performed. No intravenous contrast was administered.

[Series 3: T2 · sagittal · 4.0mm · 0.81mm/px · 5 of 13 slices shown (1 of 2)]
[im 1/13]
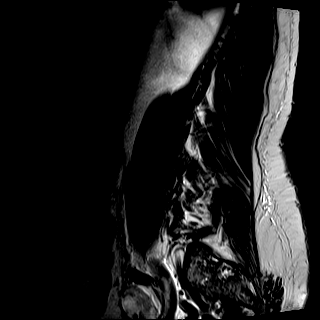
[im 4/13]
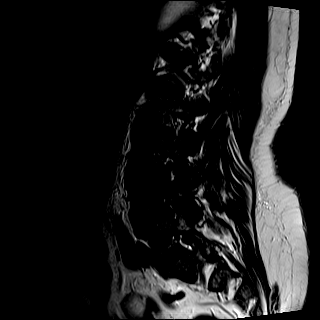
[im 7/13]
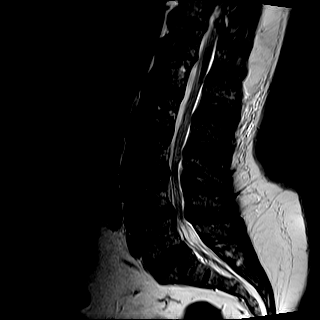
[im 10/13]
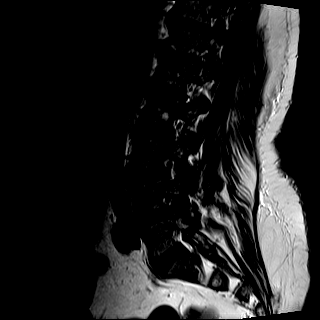
[im 13/13]
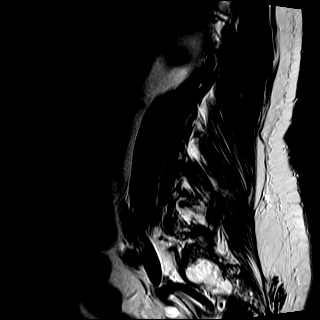

[Series 4: STIR · sagittal · 4.0mm · 1.02mm/px · 5 of 13 slices shown]
[im 1/13]
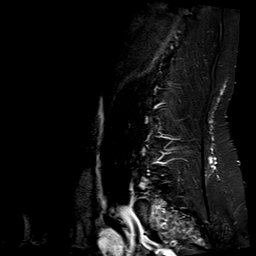
[im 4/13]
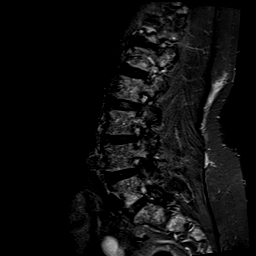
[im 7/13]
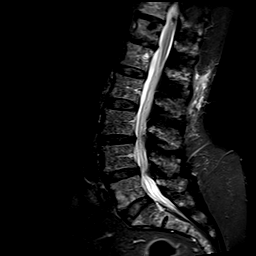
[im 10/13]
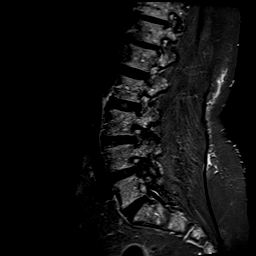
[im 13/13]
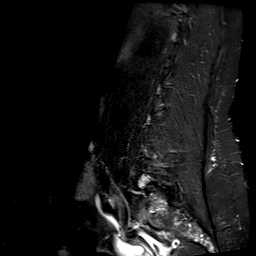

[Series 5: T1 · sagittal · 4.0mm · 1.02mm/px · 4 of 13 slices shown (1 of 2)]
[im 1/13]
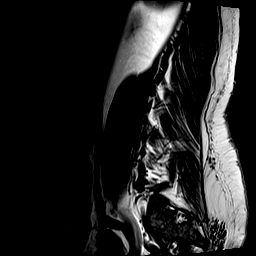
[im 5/13]
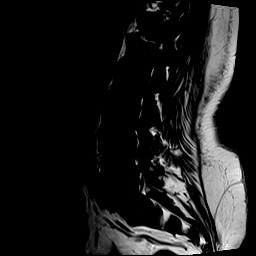
[im 9/13]
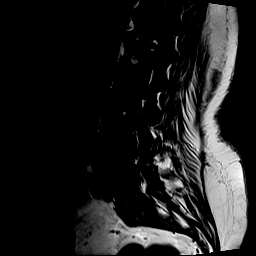
[im 13/13]
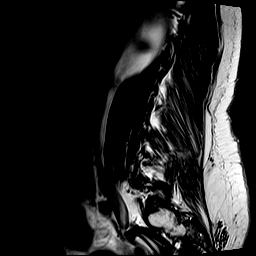

[Series 6: T2 · axial · 4.0mm · 0.78mm/px · z∈[-19,+223]mm · 14 of 41 slices shown (2 of 2)]
[im 1/41]
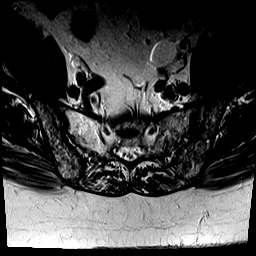
[im 4/41]
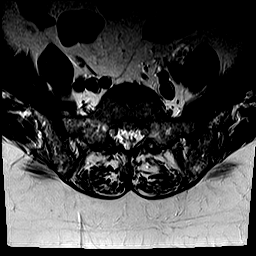
[im 7/41]
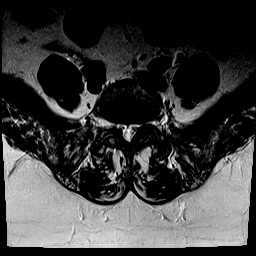
[im 10/41]
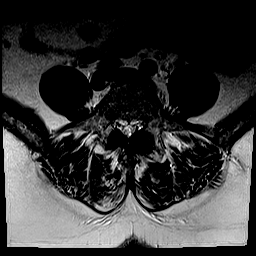
[im 13/41]
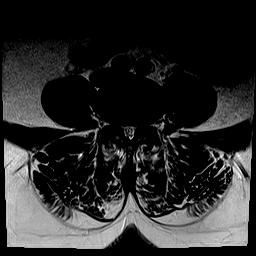
[im 16/41]
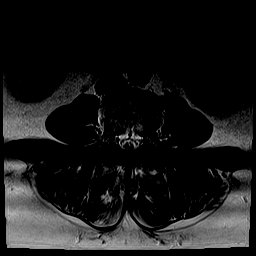
[im 19/41]
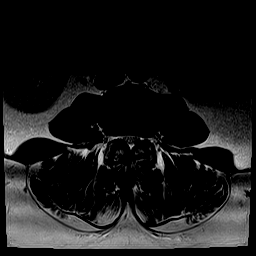
[im 22/41]
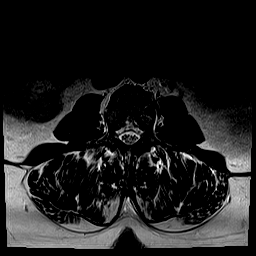
[im 25/41]
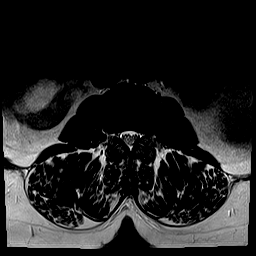
[im 28/41]
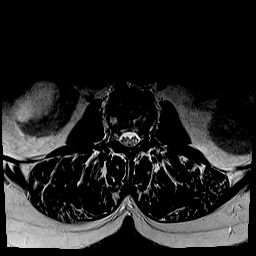
[im 31/41]
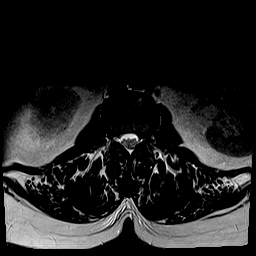
[im 34/41]
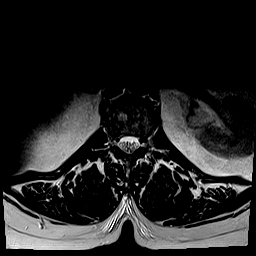
[im 37/41]
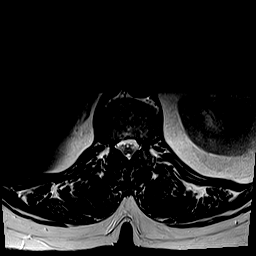
[im 41/41]
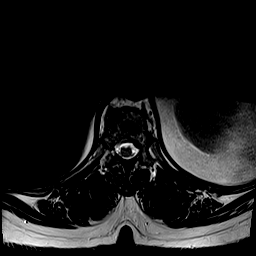

[Series 7: T1 · axial · 4.0mm · 0.78mm/px · z∈[-19,+223]mm · 13 of 41 slices shown (2 of 2)]
[im 1/41]
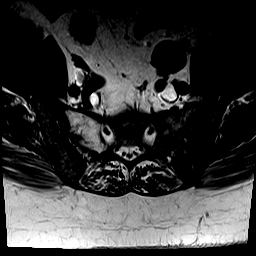
[im 4/41]
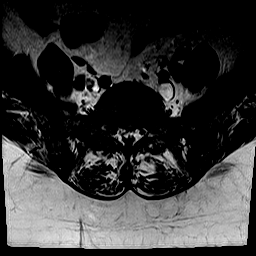
[im 7/41]
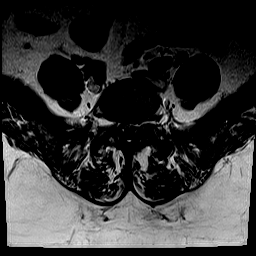
[im 10/41]
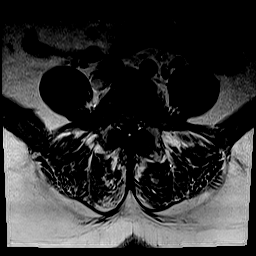
[im 13/41]
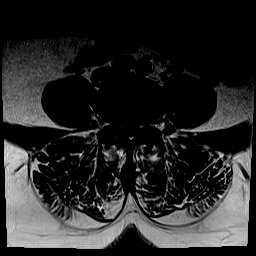
[im 16/41]
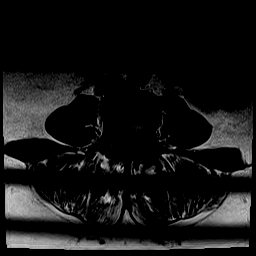
[im 19/41]
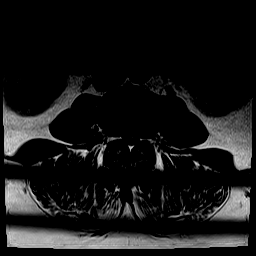
[im 22/41]
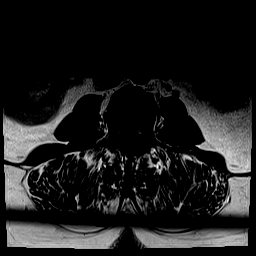
[im 25/41]
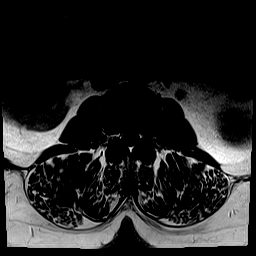
[im 28/41]
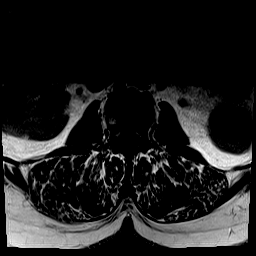
[im 31/41]
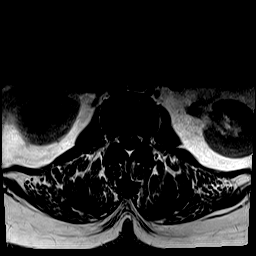
[im 34/41]
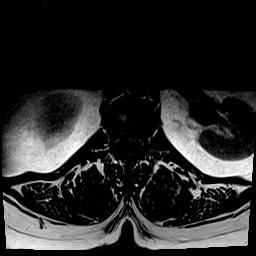
[im 41/41]
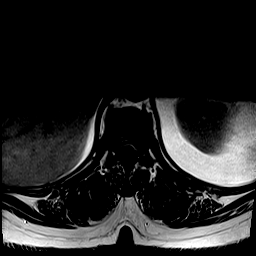

[Series 101: hx · axial · 10.0mm · 0.66mm/px · z∈[-24,+170]mm · 6 of 18 slices shown]
[im 1/18]
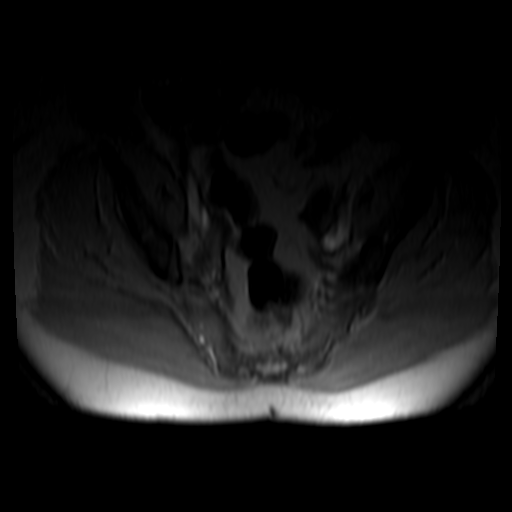
[im 4/18]
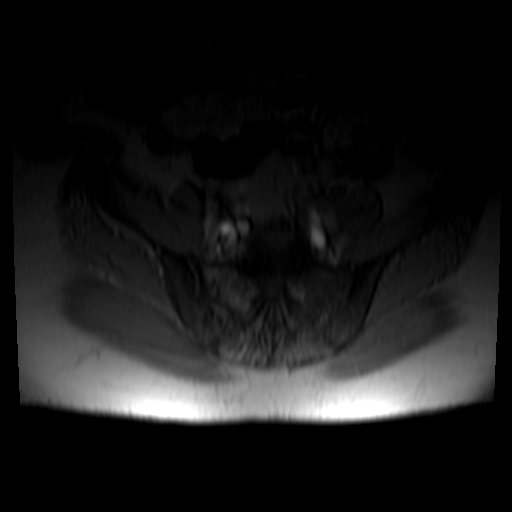
[im 7/18]
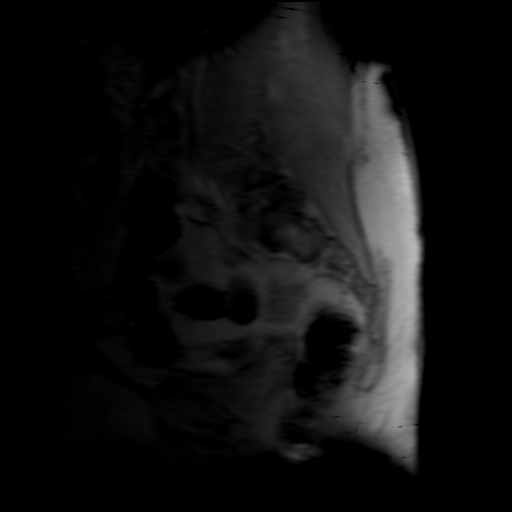
[im 11/18]
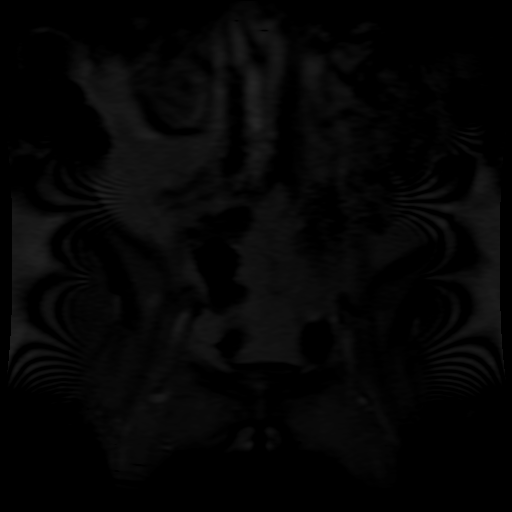
[im 14/18]
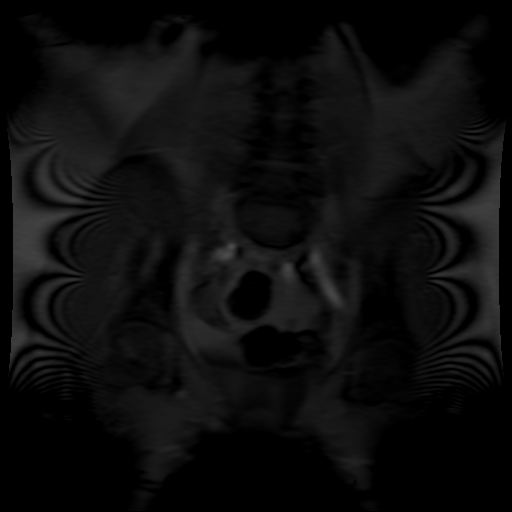
[im 18/18]
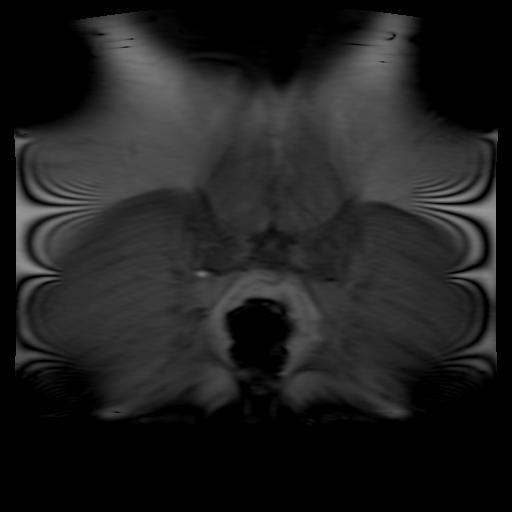

[47 of 48 positions shown; findings below may reference images not displayed]

FINDINGS: Segmentation:  5 lumbar type vertebrae

Alignment:  Physiologic.

Vertebrae: Diffusely heterogeneous marrow with predominantly low T1
signal. No fracture, discitis, or focal bone lesion. T12 chronic
superior endplate Schmorl's node

Conus medullaris and cauda equina: Conus extends to the L1 level.
Conus and cauda equina appear normal.

Paraspinal and other soft tissues: Partially covered right renal
cystic intensity. Right ovarian cyst with simple T2 appearance where
covered, 2.5 cm.

Disc levels:

T12- L1: Greatest level of disc narrowing with endplate degeneration
and mild ridging. No neural impingement

L1-L2: Unremarkable.

L2-L3: Unremarkable.

L3-L4: Mild disc narrowing and bulging with degenerative facet
spurring. Moderate spinal stenosis which is accentuated by epidural
fat. No foraminal impingement

L4-L5: Disc narrowing and bulging with posterior element
hypertrophy. Spinal stenosis is advanced. Noncompressive bilateral
foraminal narrowing

L5-S1:Advanced facet osteoarthritis with spurring and slight
anterolisthesis. No impingement.
IMPRESSION: 1. Generalized heterogeneity of marrow, please correlate with
hematologic labs.
2. Degenerative spinal stenosis that is advanced at L4-5 and
moderate at L3-4.
3. 2.5 cm right ovarian cyst with simple T2 appearance where
covered.

## 2021-05-09 DIAGNOSIS — E119 Type 2 diabetes mellitus without complications: Secondary | ICD-10-CM

## 2021-05-10 ENCOUNTER — Inpatient Hospital Stay: Admit: 2021-05-10 | Payer: PRIVATE HEALTH INSURANCE

## 2021-05-10 LAB — BASIC METABOLIC PANEL
Anion Gap: 5 mmol/L (ref 5–15)
BUN: 19 mg/dl (ref 9–23)
CO2: 29 mEq/L (ref 20–31)
Calcium: 8 mg/dl — ABNORMAL LOW (ref 8.7–10.4)
Chloride: 103 mEq/L (ref 98–107)
Creatinine: 1.36 mg/dl — ABNORMAL HIGH (ref 0.55–1.02)
EGFR IF NonAfrican American: 40
GFR African American: 49
Glucose: 92 mg/dl (ref 74–106)
Potassium: 3.7 mEq/L (ref 3.4–4.5)
Sodium: 137 mEq/L (ref 136–145)

## 2021-05-10 LAB — MAGNESIUM
Magnesium: 2.1 mg/dl (ref 1.6–2.6)
Magnesium: 2.1 mg/dl (ref 1.6–2.6)

## 2021-05-10 LAB — METABOLIC PANEL, BASIC
Anion gap: 5 mmol/L (ref 5–15)
BUN: 19 mg/dl (ref 9–23)
CO2: 29 mEq/L (ref 20–31)
Calcium: 8 mg/dl — ABNORMAL LOW (ref 8.7–10.4)
Chloride: 103 mEq/L (ref 98–107)
Creatinine: 1.36 mg/dl — ABNORMAL HIGH (ref 0.55–1.02)
GFR est AA: 49
GFR est non-AA: 40
Glucose: 92 mg/dl (ref 74–106)
Potassium: 3.7 mEq/L (ref 3.4–4.5)
Sodium: 137 mEq/L (ref 136–145)

## 2021-05-12 LAB — HEMOGLOBIN A1C W/O EAG
Hemoglobin A1C: 5 % (ref 3.8–5.6)
Hemoglobin A1c: 5 % (ref 3.8–5.6)

## 2021-06-15 ENCOUNTER — Inpatient Hospital Stay: Admit: 2021-06-15 | Payer: PRIVATE HEALTH INSURANCE

## 2021-06-15 DIAGNOSIS — N390 Urinary tract infection, site not specified: Secondary | ICD-10-CM

## 2021-06-15 LAB — URINALYSIS W/ RFLX MICROSCOPIC
Bilirubin, Urine: NEGATIVE
Bilirubin: NEGATIVE
Blood, Urine: NEGATIVE
Blood: NEGATIVE
Glucose, Ur: NEGATIVE mg/dl
Glucose: NEGATIVE mg/dL
Ketone: NEGATIVE mg/dL
Ketones, Urine: NEGATIVE mg/dl
Nitrite, Urine: NEGATIVE
Nitrites: NEGATIVE
Protein, UA: NEGATIVE mg/dl
Protein: NEGATIVE mg/dL
Specific Gravity, UA: 1.01 (ref 1.005–1.030)
Specific gravity: 1.01 (ref 1.005–1.030)
Urobilinogen, UA, POCT: 0.2 mg/dl (ref 0.0–1.0)
Urobilinogen: 0.2 mg/dL (ref 0.0–1.0)
pH (UA): 6 (ref 5.0–9.0)
pH, UA: 6 (ref 5.0–9.0)

## 2021-06-15 LAB — POC URINE MICROSCOPIC

## 2021-06-17 LAB — CULTURE, URINE: Isolate: 70000 — AB

## 2021-07-07 IMAGING — RF DG C-ARM 1-60 MIN
1 series · 2 of 2 positions shown · non-contrast
Comparison: Lumbar radiographs June 11, 2020

FLUOROSCOPY TIME:  1 minutes 16 seconds; 111.60 mGy; 2 acquired
images

CLINICAL DATA: Status post fusion L4 and L5

EXAM:
LUMBAR SPINE - 2-3 VIEW; DG C-ARM 1-60 MIN

[Series 1: run · 2 of 2 slices shown]
[im 1/2]
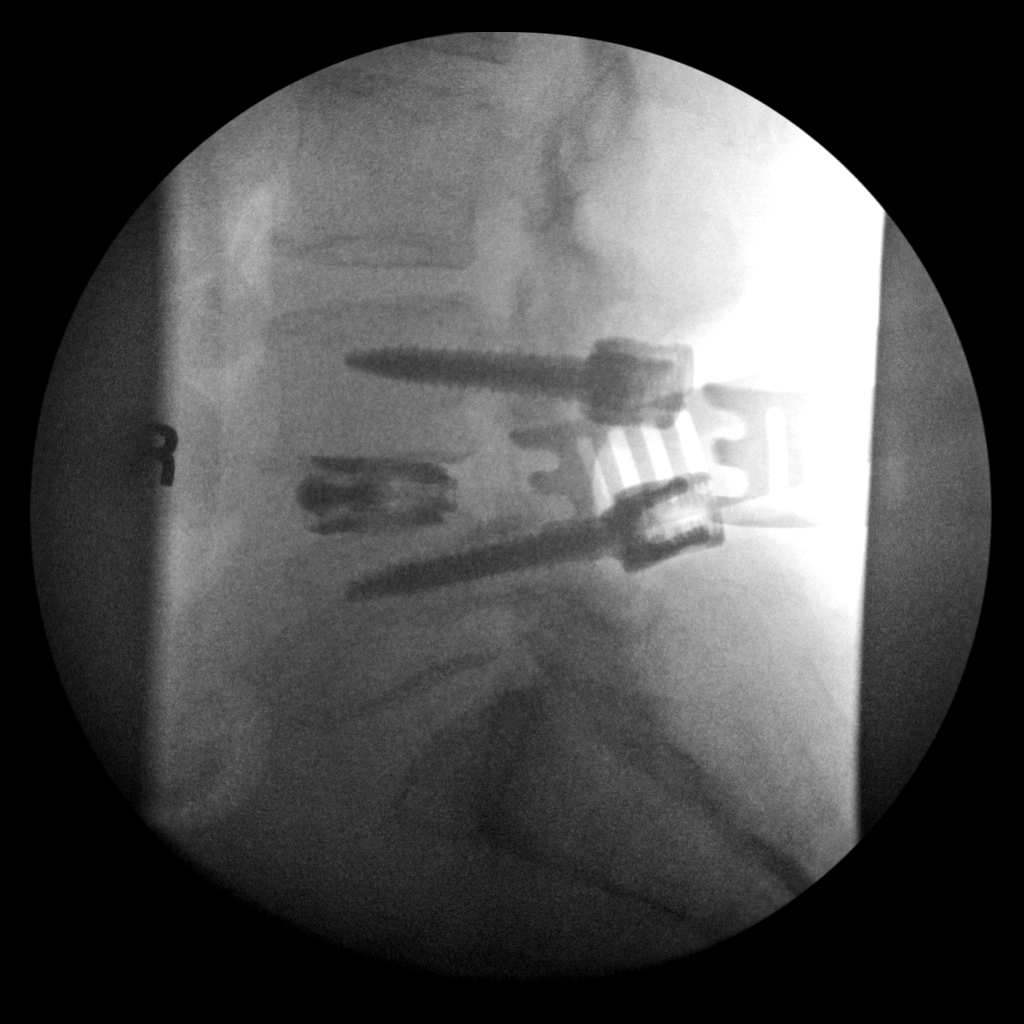
[im 2/2]
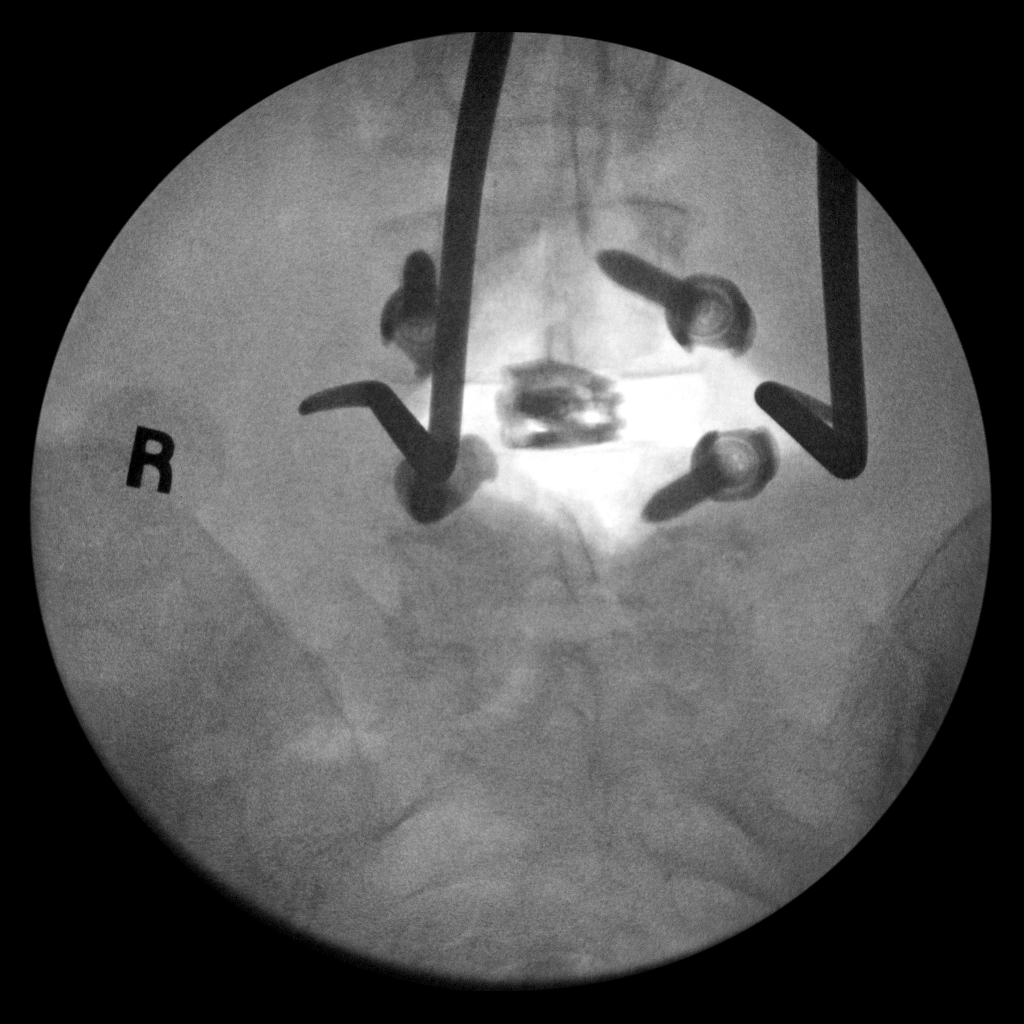

[2 of 2 positions shown; findings below may reference images not displayed]

FINDINGS: Frontal and lateral views were obtained. There are pedicle screws at
L4 and L5 with screw tips in respective vertebral bodies. There is a
disc spacer at L4-5. No fracture or spondylolisthesis. Visualized
lumbar discs intact.
IMPRESSION: Pedicle screws at L4 and L5 with screw tips in respective vertebral
bodies. Disc spacer at L4-5. No fracture or spondylolisthesis
evident.

## 2021-09-14 ENCOUNTER — Inpatient Hospital Stay: Admit: 2021-09-14 | Payer: PRIVATE HEALTH INSURANCE

## 2021-09-15 LAB — CULTURE, URINE
CULTURE RESULT: NO GROWTH
Culture Result: NO GROWTH

## 2021-09-15 LAB — URINALYSIS W/ RFLX MICROSCOPIC
Bilirubin, Urine: NEGATIVE
Bilirubin: NEGATIVE
Blood, Urine: NEGATIVE
Blood: NEGATIVE
Glucose, Ur: NEGATIVE mg/dl
Glucose: NEGATIVE mg/dL
Ketone: NEGATIVE mg/dL
Ketones, Urine: NEGATIVE mg/dl
Leukocyte Esterase, Urine: NEGATIVE
Leukocyte Esterase: NEGATIVE
Nitrite, Urine: NEGATIVE
Nitrites: NEGATIVE
Protein, UA: NEGATIVE mg/dl
Protein: NEGATIVE mg/dL
Specific Gravity, UA: 1.025 (ref 1.005–1.030)
Specific gravity: 1.025 (ref 1.005–1.030)
Urobilinogen, UA, POCT: 0.2 mg/dl (ref 0.0–1.0)
Urobilinogen: 0.2 mg/dL (ref 0.0–1.0)
pH (UA): 6 (ref 5.0–9.0)
pH, UA: 6 (ref 5.0–9.0)

## 2022-01-06 ENCOUNTER — Inpatient Hospital Stay: Admit: 2022-01-06 | Payer: PRIVATE HEALTH INSURANCE

## 2022-01-06 DIAGNOSIS — N390 Urinary tract infection, site not specified: Secondary | ICD-10-CM

## 2022-01-06 LAB — URINALYSIS W/ RFLX MICROSCOPIC
Bilirubin, Urine: NEGATIVE
Bilirubin: NEGATIVE
Blood, Urine: NEGATIVE
Blood: NEGATIVE
Glucose, Ur: NEGATIVE mg/dl
Glucose: NEGATIVE mg/dL
Ketone: NEGATIVE mg/dL
Ketones, Urine: NEGATIVE mg/dl
Nitrite, Urine: NEGATIVE
Nitrites: NEGATIVE
Protein, UA: NEGATIVE mg/dl
Protein: NEGATIVE mg/dL
Specific Gravity, UA: <=1.005 (ref 1.005–1.030)
Specific gravity: <=1.005 (ref 1.005–1.030)
Urobilinogen, UA, POCT: 0.2 mg/dl (ref 0.0–1.0)
Urobilinogen: 0.2 mg/dL (ref 0.0–1.0)
pH (UA): 6 (ref 5.0–9.0)
pH, UA: 6 (ref 5.0–9.0)

## 2022-01-06 LAB — POC URINE MICROSCOPIC

## 2022-01-08 LAB — CULTURE, URINE: Isolate: >100000 — AB

## 2022-02-04 ENCOUNTER — Inpatient Hospital Stay: Admit: 2022-02-04 | Payer: MEDICARE

## 2022-02-04 LAB — LIPID PANEL
Chol/HDL Ratio: 3 Ratio (ref 0.0–4.4)
Cholesterol: 139 mg/dl (ref 0–199)
HDL: 46 mg/dl (ref 40–60)
LDL Calculated: 57 mg/dl (ref 0–130)
Triglycerides: 178 mg/dl — ABNORMAL HIGH (ref 0–150)

## 2022-02-04 LAB — TSH: TSH, High Sensitivity: 3.582 u[IU]/mL (ref 0.550–4.780)

## 2022-02-04 LAB — HEMOGLOBIN A1C: Hemoglobin A1C: 5.6 % (ref 3.8–5.6)

## 2022-02-04 LAB — MAGNESIUM: Magnesium: 2.2 mg/dl (ref 1.6–2.6)

## 2022-02-04 LAB — VITAMIN D 25 HYDROXY: Vit D, 25-Hydroxy: 28.6 ng/ml — ABNORMAL LOW (ref 30.0–100.0)

## 2022-02-27 DIAGNOSIS — Z0000 Encounter for general adult medical examination without abnormal findings: Secondary | ICD-10-CM

## 2022-02-28 ENCOUNTER — Inpatient Hospital Stay: Admit: 2022-02-28 | Payer: MEDICARE

## 2022-02-28 LAB — URINALYSIS
Bilirubin, Urine: NEGATIVE
Blood, Urine: NEGATIVE
Glucose, Ur: NEGATIVE mg/dl
Ketones, Urine: NEGATIVE mg/dl
Nitrite, Urine: NEGATIVE
Specific Gravity, Urine: 1.015 (ref 1.005–1.030)
Urobilinogen, Urine: 0.2 mg/dl (ref 0.0–1.0)
pH, Urine: 6 (ref 5.0–9.0)

## 2022-02-28 LAB — MICROSCOPIC URINALYSIS

## 2022-03-01 LAB — CULTURE, URINE: Culture Result: 35000 — AB

## 2022-05-12 ENCOUNTER — Inpatient Hospital Stay: Admit: 2022-05-12 | Payer: MEDICARE

## 2022-05-12 DIAGNOSIS — E612 Magnesium deficiency: Secondary | ICD-10-CM

## 2022-05-12 LAB — COMPREHENSIVE METABOLIC PANEL
ALT: 15 U/L (ref 10–49)
AST: 20 U/L (ref 0.0–33.9)
Albumin: 3.7 gm/dl (ref 3.4–5.0)
Alkaline Phosphatase: 35 U/L — ABNORMAL LOW (ref 46–116)
Anion Gap: 8 mmol/L (ref 5–15)
BUN: 19 mg/dl (ref 9–23)
CO2: 27 mEq/L (ref 20–31)
Calcium: 8.9 mg/dl (ref 8.7–10.4)
Chloride: 108 mEq/L — ABNORMAL HIGH (ref 98–107)
Creatinine: 1.1 mg/dl — ABNORMAL HIGH (ref 0.55–1.02)
GFR African American: >60
GFR Non-African American: 51
Glucose: 85 mg/dl (ref 74–106)
Potassium: 4.3 mEq/L (ref 3.5–5.1)
Sodium: 143 mEq/L (ref 136–145)
Total Bilirubin: 0.4 mg/dl (ref 0.30–1.20)
Total Protein: 6.7 gm/dl (ref 5.7–8.2)

## 2022-05-12 LAB — MAGNESIUM: Magnesium: 2.1 mg/dL (ref 1.6–2.6)

## 2022-05-12 LAB — CBC WITH AUTO DIFFERENTIAL
Basophils: 2.5 % (ref 0–3)
Eosinophils: 2.5 % (ref 0–5)
Hematocrit: 33.3 % — ABNORMAL LOW (ref 35.0–47.0)
Hemoglobin: 11 gm/dl (ref 11.0–16.0)
Immature Granulocytes: 0.6 % (ref 0.0–3.0)
Lymphocytes: 37.7 % (ref 28–48)
MCH: 34.4 pg (ref 25.4–34.6)
MCHC: 33 gm/dl (ref 30.0–36.0)
MCV: 104.1 fL — ABNORMAL HIGH (ref 80.0–98.0)
MPV: 9.2 fL (ref 6.0–10.0)
Monocytes: 10.9 % (ref 1–13)
Neutrophils Segmented: 45.8 % (ref 34–64)
Nucleated RBCs: 0 (ref 0–0)
Platelet Appearance: NORMAL
Platelets: 222 10*3/uL (ref 140–450)
RBC: 3.2 M/uL — ABNORMAL LOW (ref 3.60–5.20)
RDW: 67.3 — ABNORMAL HIGH (ref 36.4–46.3)
WBC: 3.2 10*3/uL — ABNORMAL LOW (ref 4.0–11.0)

## 2022-06-09 ENCOUNTER — Inpatient Hospital Stay: Admit: 2022-06-09 | Payer: MEDICARE

## 2022-06-09 DIAGNOSIS — N390 Urinary tract infection, site not specified: Secondary | ICD-10-CM

## 2022-06-09 LAB — URINALYSIS
Blood, Urine: NEGATIVE
Glucose, Ur: NEGATIVE mg/dl
Ketones, Urine: NEGATIVE mg/dl
Nitrite, Urine: NEGATIVE
Specific Gravity, Urine: 1.025 (ref 1.005–1.030)
Urobilinogen, Urine: 0.2 mg/dl (ref 0.0–1.0)
pH, Urine: 5.5 (ref 5.0–9.0)

## 2022-06-09 LAB — MICROSCOPIC URINALYSIS

## 2022-06-11 LAB — CULTURE, URINE: Culture Result: 20000 — AB

## 2022-06-28 ENCOUNTER — Inpatient Hospital Stay: Admit: 2022-06-28 | Payer: MEDICARE

## 2022-06-28 DIAGNOSIS — E559 Vitamin D deficiency, unspecified: Secondary | ICD-10-CM

## 2022-06-28 LAB — CBC WITH AUTO DIFFERENTIAL
Basophils: 3 % (ref 0–3)
Eosinophils: 1.6 % (ref 0–5)
Hematocrit: 34.8 % — ABNORMAL LOW (ref 35.0–47.0)
Hemoglobin: 11.7 gm/dl (ref 11.0–16.0)
Immature Granulocytes: 0 % (ref 0.0–3.0)
Lymphocytes: 47.7 % (ref 28–48)
MCH: 35.7 pg — ABNORMAL HIGH (ref 25.4–34.6)
MCHC: 33.6 gm/dl (ref 30.0–36.0)
MCV: 106.1 fL — ABNORMAL HIGH (ref 80.0–98.0)
MPV: 9.5 fL (ref 6.0–10.0)
Monocytes: 7.9 % (ref 1–13)
Neutrophils Segmented: 39.8 % (ref 34–64)
Nucleated RBCs: 0 (ref 0–0)
Platelets: 237 10*3/uL (ref 140–450)
RBC: 3.28 M/uL — ABNORMAL LOW (ref 3.60–5.20)
RDW: 54.1 — ABNORMAL HIGH (ref 36.4–46.3)
WBC: 3 10*3/uL — ABNORMAL LOW (ref 4.0–11.0)

## 2022-06-28 LAB — BASIC METABOLIC PANEL
Anion Gap: 9 mmol/L (ref 5–15)
BUN: 15 mg/dl (ref 9–23)
CO2: 25 mEq/L (ref 20–31)
Calcium: 9.1 mg/dl (ref 8.7–10.4)
Chloride: 110 mEq/L — ABNORMAL HIGH (ref 98–107)
Creatinine: 1.14 mg/dl — ABNORMAL HIGH (ref 0.55–1.02)
GFR African American: 60
GFR Non-African American: 49
Glucose: 94 mg/dl (ref 74–106)
Potassium: 4.6 mEq/L (ref 3.5–5.1)
Sodium: 144 mEq/L (ref 136–145)

## 2022-06-28 LAB — VITAMIN D 25 HYDROXY: Vit D, 25-Hydroxy: 34.1 ng/ml (ref 30.0–100.0)

## 2022-06-28 LAB — TSH: TSH, High Sensitivity: 1.14 u[IU]/mL (ref 0.550–4.780)

## 2022-11-06 DIAGNOSIS — R059 Cough, unspecified: Secondary | ICD-10-CM

## 2022-11-07 ENCOUNTER — Inpatient Hospital Stay: Admit: 2022-11-07 | Payer: MEDICARE

## 2022-11-07 LAB — COVID-19, FLU A/B, AND RSV COMBO
Influenza A H1 (Seasonal) PCR: NEGATIVE
Influenza virus B RNA: NEGATIVE
RSV A/B PCR: POSITIVE — AB
SARS-CoV-2: NEGATIVE

## 2022-11-08 ENCOUNTER — Inpatient Hospital Stay: Admit: 2022-11-08 | Payer: MEDICARE

## 2022-11-08 DIAGNOSIS — Z0000 Encounter for general adult medical examination without abnormal findings: Secondary | ICD-10-CM

## 2022-11-08 LAB — COMPREHENSIVE METABOLIC PANEL
ALT: 16 U/L (ref 10–49)
AST: 21 U/L (ref 0.0–33.9)
Albumin: 3.3 gm/dl — ABNORMAL LOW (ref 3.4–5.0)
Alkaline Phosphatase: 29 U/L — ABNORMAL LOW (ref 46–116)
Anion Gap: 9 mmol/L (ref 5–15)
BUN: 17 mg/dl (ref 9–23)
CO2: 23 mEq/L (ref 20–31)
Calcium: 8.4 mg/dl — ABNORMAL LOW (ref 8.7–10.4)
Chloride: 106 mEq/L (ref 98–107)
Creatinine: 1.14 mg/dl — ABNORMAL HIGH (ref 0.55–1.02)
GFR African American: 59
GFR Non-African American: 49
Glucose: 92 mg/dl (ref 74–106)
Potassium: 4.5 mEq/L (ref 3.5–5.1)
Sodium: 138 mEq/L (ref 136–145)
Total Bilirubin: 0.4 mg/dl (ref 0.30–1.20)
Total Protein: 6.1 gm/dl (ref 5.7–8.2)

## 2022-11-08 LAB — MAGNESIUM: Magnesium: 2.2 mg/dL (ref 1.6–2.6)

## 2022-11-08 LAB — HEMOGLOBIN A1C: Hemoglobin A1C: 5.3 % (ref 3.8–5.6)

## 2023-01-10 NOTE — ED Notes (Signed)
Formatting of this note might be different from the original.  Pt AO4 no signs of resp distress. Pt states that she had a fall. Denies hitting head.    Pt has swelling right eye and discoloration. Pt has scars on bil knees from the fall.    Pt has pain 4/10 bil knees, pt refused to take any pain meds at this time   Electronically signed by Laurey Morale, RN at 01/10/2023 10:11 AM EST

## 2023-01-10 NOTE — ED Notes (Signed)
Formatting of this note might be different from the original.  Pt is off floor to CT  Electronically signed by Laurey Morale, RN at 01/10/2023  9:54 AM EST

## 2023-01-10 NOTE — ED Notes (Signed)
Formatting of this note might be different from the original.  Pain assessment on discharge was addressed.  Condition stable.  Patient discharged to home.  Patient education was completed:  yes  Education taught to:  patient  Teaching method used was discussion and handout.  Understanding of teaching was good.  Patient was discharged ambulatory.  Discharged with self.  Valuables were given to: patient.   Electronically signed by Laurey Morale, RN at 01/10/2023 12:14 PM EST

## 2023-01-10 NOTE — ED Notes (Signed)
Formatting of this note might be different from the original.  Wheeled stretcher into the bathroom and placed patient on bedpan. Placed a new brief and blue pad under patient  Electronically signed by Laurey Morale, RN at 01/10/2023 11:07 AM EST

## 2023-01-10 NOTE — ED Provider Notes (Signed)
Formatting of this note is different from the original.    [place]    Time of Arrival:   01/10/23 0848    Final diagnoses:   [id number] Injury of head, initial encounter (Primary)   [T14.8XXA] Hematoma     Medical Decision Making: Patient is a 77 year old female presenting today for evaluation of a fall.  Upon arrival patient is hemodynamically stable, afebrile.  Physical exam reveals a well-developed, well-nourished female who is in no acute distress.  Patient did have a hematoma to the right frontal forehead with no other facial bone tenderness.  She has some abrasions to her right cheek as well.  No lacerations to the scalp noted.  Mild upper cervical midline tenderness.  No other midline spinal tenderness.  Bilateral knee abrasions as well.  No traumatic injuries noted.    Differential diagnosis includes, but not limited to, hematoma, intracranial hemorrhage, skull fracture, facial bone fracture, cervical fracture.  Patient will receive CT scans of the head, face, cervical spine x-rays of the bilateral knees.    Observation/admission was considered for this patient however she has no acute traumatic injuries and is stable for outpatient management where she already has PT set up.    Differential Diagnosis:       Social Determinants of Health:  social factors reviewed, did not limit treatment                       Glasgow Coma Scale Score: 15          Supplemental Historians include:  patient    ED Course:     ED Course as of 01/10/23 1155   Wed Jan 10, 2023   1013 Knee x-rays as interpreted by myself revealed no significant fractures. [DM]   [id number] CT scan of the head as interpreted myself reveals no obvious intracranial hemorrhages. [DM]   1150 Official reads demonstrate no significant traumatic injuries.  Patient has known hematoma.  She has previous fracture of the nose but has no point tenderness at this time that would suggest an acute fracture.  She is stable for discharge home at  this time.  Patient is at a facility and she is receiving PT at this time.  Will give her dose of oxycodone prior to discharge and a short written prescription for oxycodone as needed. [DM]     Documentation/Prior Results Review:  Old medical records    Rhythm interpretation from monitor: N/A    Imaging Interpreted by me: X-Ray see ed course and CT see ed course    ED CT HEAD NO CONTRAST   Final Result   1. No acute intracranial process.   2. Left frontal scalp and periorbital soft tissue hematoma. Consider correlation with ophthalmological evaluation of right optic globe.     Signed By: Wille Celeste, MD on 01/10/2023 11:21 AM       CT FACIAL W/O CONTRAST   Final Result   1. Slight deformity of proximal right nasal bone may be related to prior trauma. Please correlate with point tenderness.   2. Right frontal scalp and periorbital soft tissue hematoma.     Signed By: Wille Celeste, MD on 01/10/2023 11:42 AM       CT CERVICAL SPINE W/O CONTRAST   Final Result   1. No acute fracture.     2. Diffuse mottled appearance of visualized bones compatible with patient history of osseous metastatic disease. Please see priors.  3. Multilevel degenerative changes.     Signed By: Wille Celeste, MD on 01/10/2023 11:25 AM       KNEE 3 VIEWS LT   Final Result     1. No acute osseous abnormality.   2. Status post medial unicondylar knee arthroplasty without obvious complications.   3. 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     Signed By: Astrid Drafts, MD on 01/10/2023 10:07 AM       KNEE 3 VIEWS RT   Final Result     1. No acute osseous abnormality.   2. Status post total knee arthroplasty without obvious complications.   3. Mild patellofemoral knee osteoarthritis.     Signed By: Astrid Drafts, MD on 01/10/2023 10:09 AM         .     Discussion of Management with other Physicians, QHP or Appropriate Source:   None    .    Disposition:  Home    New Prescriptions    OXYCODONE-ACETAMINOPHEN (PERCOCET) 5-325 MG PO  TABS    Take 1 Tab by Mouth Every 6 Hours As Needed for up to 3 days.     Chief Complaint   Patient presents with    [place]     patient is a 77 year old female with a past history of stage IV breast cancer, hypertension, hyperlipidemia, hypothyroidism presenting today for evaluation of mechanical fall.  Patient resides at [facility] had a mechanical fall while walking with her rollator.  She says she just tripped "over her own 2 feet."  She hit her head on the ground and scraped her knees as well.  She denies any focal weakness, numbness, tingling, denies any vision changes, denies any significant nausea, vomiting.  Patient does endorse bruising to her forehead.    Review of Systems    Physical Exam  Constitutional:       Appearance: Normal appearance.   HENT:      Head: Normocephalic.      Comments: Hematoma noted to the right frontal forehead above the eye.     Mouth/Throat:      Mouth: Mucous membranes are moist.   Eyes:      General:         Right eye: No discharge.         Left eye: No discharge.      Extraocular Movements: Extraocular movements intact.      Conjunctiva/sclera: Conjunctivae normal.      Pupils: Pupils are equal, round, and reactive to light.   Cardiovascular:      Rate and Rhythm: Normal rate and regular rhythm.      Pulses: Normal pulses.      Heart sounds: Normal heart sounds.   Pulmonary:      Effort: Pulmonary effort is normal. No respiratory distress.      Breath sounds: No wheezing.   Abdominal:      General: Abdomen is flat. There is no distension.      Palpations: Abdomen is soft. There is no mass.      Tenderness: There is no abdominal tenderness. There is no guarding.      Hernia: No hernia is present.   Musculoskeletal:         General: Tenderness present.      Right lower leg: No edema.      Left lower leg: No edema.      Comments: Mild upper cervical midline tenderness, bilateral  knee abrasions with mild tenderness.  No tenderness to any other large joints, no other  midline spinal tenderness noted.   Skin:     General: Skin is warm.   Neurological:      General: No focal deficit present.      Mental Status: She is alert and oriented to person, place, and time. Mental status is at baseline.   Psychiatric:         Mood and Affect: Mood normal.         Behavior: Behavior normal.     Past Medical History:   Diagnosis Date    Exercise tolerance finding 07/2019    Denies CP or SOB with ADL's    HLD (hyperlipidemia)     HTN (hypertension)     Hypothyroid     Osteoarthrosis     Sleep apnea     CPAP     Past Surgical History:   Procedure Laterality Date    CARPAL TUNNEL RELEASE Left     CATARACT REMOVAL WITH IOL INSERTION Bilateral     FOOT SURGERIES Left     HEEL    JOINT REPLACEMENT Left 2008    partial    KNEE REPLACEMENT Right 07/28/2019    Procedure: ARTHROPLASTY KNEE REPLACEMENT;  Surgeon: Clarisa Fling, MD    PARTIAL HYSTERECTOMY  1975     No family history on file.  Social History     Occupational History    Not on file   Tobacco Use    Smoking status: Never    Smokeless tobacco: Never   Substance and Sexual Activity    Alcohol use: Never    Drug use: Never    Sexual activity: Not on file     Outpatient Medications Marked as Taking for the 01/10/23 encounter [facility] Encounter)   Medication Sig Dispense Refill    oxyCODONE-acetaminophen (PERCOCET) 5-325 mg PO TABS Take 1 Tab by Mouth Every 6 Hours As Needed for up to 3 days. 12 Tab 0     Allergies   Allergen Reactions    Sulfa (Sulfonamide Antibiotics) hives     Vital Signs:  Patient Vitals for the past 72 hrs:   Temp Heart Rate Resp BP BP Mean SpO2 Weight   01/10/23 1109 97.9 F (36.6 C) -- -- -- -- -- --   01/10/23 1100 -- 88 16 172/85 (!) 114 MM HG 100 % --   01/10/23 1002 -- -- -- -- -- -- 105.2 kg (232 lb)   01/10/23 0900 -- 97 18 180/98 (!) 125 MM HG 100 % --     Diagnostics:  Labs:  No results found for this visit on 01/10/23.  ECG:  No results found for this visit on 01/10/23.    Medications ordered/given in the  ED  Medications   HYDROcodone-acetaminophen (Norco 5) 5-325 mg 1 Tab (has no administration in time range)       Electronically signed by Leland Her, MD at 01/10/2023 11:56 AM EST

## 2023-01-10 NOTE — ED Triage Notes (Signed)
Formatting of this note might be different from the original.  Pt arrived via transport from [facility]     Pt is at her baseline mental status    Pt had a mechanical fall this morning    Pt did hit her head     NO LOC    Pt is not on blood thinners    Pt is complaining of bilateral knee pain  Electronically signed by Alessandra Bevels, RN at 01/10/2023  9:00 AM EST

## 2023-02-15 ENCOUNTER — Encounter: Payer: Self-pay | Admitting: Radiology

## 2023-02-18 ENCOUNTER — Inpatient Hospital Stay: Admit: 2023-02-19 | Payer: MEDICARE

## 2023-02-18 DIAGNOSIS — N390 Urinary tract infection, site not specified: Secondary | ICD-10-CM

## 2023-02-19 ENCOUNTER — Inpatient Hospital Stay: Admit: 2023-02-19 | Payer: MEDICARE

## 2023-02-19 DIAGNOSIS — E119 Type 2 diabetes mellitus without complications: Secondary | ICD-10-CM

## 2023-02-19 LAB — COMPREHENSIVE METABOLIC PANEL
ALT: 19 U/L (ref 10–49)
AST: 22 U/L (ref 0.0–33.9)
Albumin: 3.5 gm/dl (ref 3.4–5.0)
Alkaline Phosphatase: 34 U/L — ABNORMAL LOW (ref 46–116)
Anion Gap: 6 mmol/L (ref 5–15)
BUN: 16 mg/dl (ref 9–23)
CO2: 28 mEq/L (ref 20–31)
Calcium: 9.1 mg/dl (ref 8.7–10.4)
Chloride: 104 mEq/L (ref 98–107)
Creatinine: 1.06 mg/dl — ABNORMAL HIGH (ref 0.55–1.02)
GFR African American: >60
GFR Non-African American: 53
Glucose: 94 mg/dl (ref 74–106)
Potassium: 3.7 mEq/L (ref 3.5–5.1)
Sodium: 138 mEq/L (ref 136–145)
Total Bilirubin: 0.3 mg/dl (ref 0.30–1.20)
Total Protein: 6.6 gm/dl (ref 5.7–8.2)

## 2023-02-19 LAB — URINALYSIS
Bilirubin, Urine: NEGATIVE
Glucose, Ur: NEGATIVE mg/dl
Ketones, Urine: NEGATIVE mg/dl
Nitrite, Urine: NEGATIVE
Protein, Urine: NEGATIVE mg/dl
Specific Gravity, Urine: 1.02 (ref 1.005–1.030)
Urobilinogen, Urine: 0.2 mg/dl (ref 0.0–1.0)
pH, Urine: 6 (ref 5.0–9.0)

## 2023-02-19 LAB — HEMOGLOBIN A1C: Hemoglobin A1C: 5.2 % (ref 3.8–5.6)

## 2023-02-19 LAB — MAGNESIUM: Magnesium: 2 mg/dL (ref 1.6–2.6)

## 2023-02-20 LAB — CULTURE, URINE: Culture Result: 10000 — AB

## 2023-02-20 LAB — MICROSCOPIC URINALYSIS

## 2023-03-29 ENCOUNTER — Inpatient Hospital Stay: Admit: 2023-03-30 | Payer: MEDICARE

## 2023-03-29 DIAGNOSIS — N390 Urinary tract infection, site not specified: Secondary | ICD-10-CM

## 2023-03-31 LAB — URINALYSIS
Bilirubin, Urine: NEGATIVE
Glucose, Ur: NEGATIVE mg/dl
Ketones, Urine: NEGATIVE mg/dl
Nitrite, Urine: POSITIVE — AB
Protein, Urine: 30 mg/dl — AB
Specific Gravity, Urine: >=1.03 (ref 1.005–1.030)
Urobilinogen, Urine: 0.2 mg/dl (ref 0.0–1.0)
pH, Urine: 5.5 (ref 5.0–9.0)

## 2023-03-31 LAB — MICROSCOPIC URINALYSIS

## 2023-04-01 LAB — CULTURE, URINE: Isolate: >100000 — AB

## 2023-04-13 ENCOUNTER — Ambulatory Visit: Attending: Urology

## 2023-04-13 MED ORDER — SOLIFENACIN SUCCINATE 10 MG PO TABS
10 MG | ORAL_TABLET | Freq: Every day | ORAL | 3 refills | Status: DC
Start: 2023-04-13 — End: 2023-04-23

## 2023-04-13 NOTE — Progress Notes (Unsigned)
Kristi Wallace  08-Apr-1946          No chief complaint on file.    No diagnosis found.      ASSESSMENT:    UUI       UTI   Ucx 01/06/22: >100K E. Coli PS   Ucx 02/27/22: 35K mixed flora  Ucx 06/09/22: 10K mixed  Ucx 02/18/23: 10K mixed   Ucx 03/29/23: >100K E. Coli PS      PLAN:      Straight cath UA sent for culture  PVR 34 cc   UDS referral placed to assess bladder function   Follow up 3 weeks after UDS to review with SD RUS  OK to refill medications as needed except pain medications and antibiotics      No follow-ups on file.        HISTORY OF PRESENT ILLNESS:  Kristi Wallace is a 77 y.o. female who is seen in consultation for UTI and UUI.     Patient is doing well today. She presents in a wheelchair due to bone cancer.   When patient lived in [place], they suspected contamination of water and patient possibly exposed to toxins and chemicals.   She notes she is in a wheelchair because she is a fall risks.     She was started on Ditropan and discontinued due to increased urination   When patient has a UTI she says she does not have burning with urination. However burning comes on after she has voided.     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     Patient lives at [facility]     Hx of hysterectomy   S/p knee replacement 07/28/19    LABS AND IMAGING:      No results found for this visit on 04/13/23.    CT Results:  CT FACIAL BONES WO CONTRAST 01/10/2023    Narrative  EXAM: CT FACIAL W/O CONTRAST    CLINICAL INDICATION/HISTORY: Facial trauma, blunt    COMPARISON: CT face 4/23    TECHNIQUE: Axial imaging of the face reconstructed into sagittal and coronal planes.    All CT scans at this facility are performed  using dose optimization technique as appropriate to a performed exam, to include automated exposure control, adjustment of the MA and/or kV according to patient size (including appropriate matching for site-specific examinations) or use of  iterative reconstruction technique.    FINDINGS:    Orbits: No fracture.    Midface/Nasal: Slight deformity and asymmetry of proximal right nasal bones may be related to prior trauma. No overlying soft tissue swelling is seen. Please correlate with point tenderness.    Mandible and TMJ: No fracture or dislocation.    Skull base: No fracture.    Sinuses: Clear.    Intraorbital contents: Normal.    Visualized brain: Normal.    Soft tissues: Right frontal scalp and periorbital soft tissue hematoma.    Impression  1. Slight deformity of proximal right nasal bone may be related to prior trauma. Please correlate with point tenderness.  2. Right frontal scalp and periorbital soft tissue hematoma.  Signed By: Lonna Cobb, MD on 01/10/2023 11:42 AM      [place] Results:  No results found for this or any previous visit from the past 3650 days.        PHYSICAL EXAMINATION:     There were no vitals taken for this visit.  Constitutional: Well developed, well-nourished female in no acute distress.   CV:  No peripheral swelling noted  Respiratory: No respiratory distress or difficulties  Abdomen:  Soft and nontender. No masses. No hepatosplenomegaly.   GU Female:  No CVA tenderness.     Skin:  Normal color. No evidence of jaundice.     Neuro/Psych:  Patient with appropriate affect.  Alert and oriented.    Lymphatic:   No enlargement of supraclavicular lymph nodes.        No past medical history on file.    No past surgical history on file.         Not on File    No family history on file.    No current outpatient medications on file.     No current facility-administered medications for this visit.         Review of Systems   The 14 point review of systems was completed and negative, except as noted  in HPI           A copy of today's office visit with all pertinent imaging results and labs were sent to the referring physician.      Talmadge Coventry   [facility]   [address]   [place], [place] [address]   Phone: [phone]    Fax: [phone]      Medical documentation is provided with the assistance of Talmadge Coventry, medical scribe for [employer] on 04/13/2023

## 2023-04-15 LAB — URINE C&S

## 2023-04-18 ENCOUNTER — Telehealth

## 2023-04-18 NOTE — Telephone Encounter (Signed)
Returned call from patient's sister Margarita Sermons (HIPAA verified), she requested for script for the medication that was prescribed from her visit on 04/13/23 faxed to [facility] , fax: [phone].  She said that [facility] will fax this to an out-source pharmacy that they use.    She also requested for activation code for MyChart- code sent to email address on file.    Her tel: [phone]

## 2023-04-23 MED ORDER — SOLIFENACIN SUCCINATE 10 MG PO TABS
10 | ORAL_TABLET | Freq: Every day | ORAL | 0 refills | Status: AC
Start: 2023-04-23 — End: 2023-08-21

## 2023-04-23 MED ORDER — CEPHALEXIN 500 MG PO CAPS
500 | ORAL_CAPSULE | Freq: Four times a day (QID) | ORAL | 0 refills | Status: AC
Start: 2023-04-23 — End: 2023-04-26

## 2023-04-23 MED ORDER — CEPHALEXIN 500 MG PO CAPS
500 | ORAL_CAPSULE | Freq: Four times a day (QID) | ORAL | 0 refills | Status: DC
Start: 2023-04-23 — End: 2023-04-23

## 2023-04-23 NOTE — Addendum Note (Signed)
Addended by: Carmela Rima on: 04/23/2023 08:29 AM     Modules accepted: Orders

## 2023-04-23 NOTE — Telephone Encounter (Addendum)
Spoke with Dr.Goldin and he reviewed the patient urine culture. Dr.Goldin prescribed Keflex 500mg  qid for 3 days.  Dr.Goldin was also able to sign the patient prescription for Vesicare. Patient EC informed.    PO order along with prescriptions faxed to [facility].

## 2023-05-02 NOTE — Other (Signed)
Evidence of UTI   5d macrobid

## 2023-05-02 NOTE — Addendum Note (Signed)
Addended by: Quincy Sheehan D on: 05/02/2023 11:50 PM     Modules accepted: Orders

## 2023-05-03 MED ORDER — CEPHALEXIN 500 MG PO CAPS
500 | ORAL_CAPSULE | Freq: Four times a day (QID) | ORAL | 0 refills | Status: AC
Start: 2023-05-03 — End: 2023-05-07

## 2023-05-04 NOTE — Telephone Encounter (Addendum)
Patient received Keflex from Dr.Goldin on 04/23/23. Prescription was faxed to facility.     Message  Received: Idell Pickles, Guy Begin, MD  Carmela Rima  Did she get either abx- looks like the home is saying no - if not send in the 5 day script pls    ----- Message from Maryan Char, MD sent at 05/02/2023 11:50 PM EDT -----  Evidence of UTI   5d macrobid

## 2023-06-28 DIAGNOSIS — N390 Urinary tract infection, site not specified: Secondary | ICD-10-CM

## 2023-06-29 ENCOUNTER — Inpatient Hospital Stay: Payer: MEDICARE

## 2023-06-29 ENCOUNTER — Inpatient Hospital Stay: Admit: 2023-06-29 | Payer: Medicare Other | Primary: Internal Medicine

## 2023-06-29 LAB — URINALYSIS
Bilirubin, Urine: NEGATIVE
Glucose, Ur: NEGATIVE mg/dL
Ketones, Urine: NEGATIVE mg/dL
Nitrite, Urine: NEGATIVE
Protein, Urine: NEGATIVE mg/dL
Specific Gravity, UA: 1.01 (ref 1.005–1.030)
Urobilinogen, Urine: 0.2 mg/dL (ref 0.0–1.0)
pH, Urine: 7 (ref 5.0–9.0)

## 2023-06-29 LAB — MICROSCOPIC URINALYSIS

## 2023-07-01 LAB — CULTURE, URINE: Isolate: >100000 — AB

## 2023-07-02 NOTE — Telephone Encounter (Signed)
Patient's sister called.to informed Dr. Fran Lowes that Pt did the Urine Test last Saturday the the nursing facility ; but then I informed her that Pt has an appt w/ [place] on 07/18 for Straight Cath prior to do the UDS;    Sister is asking if the patient still needs to come on 07/18 or if Dr. Fran Lowes has some plans for the patient;

## 2023-07-05 ENCOUNTER — Encounter: Admit: 2023-07-05 | Discharge: 2023-07-05 | Payer: MEDICARE

## 2023-07-05 DIAGNOSIS — N390 Urinary tract infection, site not specified: Secondary | ICD-10-CM

## 2023-07-05 LAB — AMB POC URINALYSIS DIP STICK AUTO W/O MICRO
Bilirubin, Urine, POC: NEGATIVE
Glucose, Urine, POC: NEGATIVE
Ketones, Urine, POC: NEGATIVE
Leukocyte Esterase, Urine, POC: NEGATIVE
Nitrite, Urine, POC: NEGATIVE
Protein, Urine, POC: NEGATIVE
Specific Gravity, Urine, POC: 1.02 (ref 1.001–1.035)
Urobilinogen, POC: 0.2
pH, Urine, POC: 6 (ref 4.6–8.0)

## 2023-07-05 NOTE — Progress Notes (Signed)
Kristi Wallace is here today per Dr. Fran Lowes to give a urine specimen.       She is here for  urine culture for an UDS       Urine is obtained from patient via Straight Cath.     Patient is symptomatic: no  Patient complains of: None   Patient denies Symptoms    Urine was sent for culture.   This is not a repeat urine culture.   UA performed: yes    Results for orders placed or performed in visit on 07/05/23   AMB POC URINALYSIS DIP STICK AUTO W/O MICRO   Result Value Ref Range    Color, Urine, POC Yellow     Clarity, Urine, POC Clear     Glucose, Urine, POC Negative     Bilirubin, Urine, POC Negative     Ketones, Urine, POC Negative     Specific Gravity, Urine, POC 1.020 1.001 - 1.035    Blood, Urine, POC Trace-intact     pH, Urine, POC 6.0 4.6 - 8.0    Protein, Urine, POC Negative     Urobilinogen, POC 0.2 mg/dL     Nitrite, Urine, POC Negative     Leukocyte Esterase, Urine, POC Negative         Urine was sent to [facility] for processing of urine culture.   Patient is informed that it will be at least 48 hours before results are available     Orders Placed This Encounter   Procedures    Urine C&S     Order Specific Question:   Specify all ANTIBIOTIC ALLERGIES:     Answer:   Sulfa (Bactrim)     Order Specific Question:   Specify the urine source     Answer:   Straight Cath In/Out    AMB POC URINALYSIS DIP STICK AUTO W/O MICRO    51701 - PR INSERT,NON-INDWELLING BLADDER CATHETER       Ginger Carne, MA

## 2023-07-07 LAB — URINE C&S

## 2023-07-11 NOTE — Other (Signed)
No evidence of UTI

## 2023-07-16 ENCOUNTER — Ambulatory Visit: Admit: 2023-07-16 | Discharge: 2023-07-16 | Payer: MEDICARE

## 2023-07-16 DIAGNOSIS — N390 Urinary tract infection, site not specified: Secondary | ICD-10-CM

## 2023-07-16 LAB — AMB POC URINALYSIS DIP STICK AUTO W/O MICRO
Bilirubin, Urine, POC: NEGATIVE
Glucose, Urine, POC: NEGATIVE
Ketones, Urine, POC: NEGATIVE
Nitrite, Urine, POC: POSITIVE
Protein, Urine, POC: NEGATIVE
Specific Gravity, Urine, POC: 1.02 (ref 1.001–1.035)
Urobilinogen, POC: 0.2
pH, Urine, POC: 6 (ref 4.6–8.0)

## 2023-07-16 MED ORDER — CEPHALEXIN 500 MG PO CAPS
500 | ORAL_CAPSULE | Freq: Once | ORAL | 0 refills | Status: AC
Start: 2023-07-16 — End: 2023-07-16

## 2023-07-16 NOTE — Progress Notes (Signed)
Triage note universal pause    Universal Procedure Pause:    1. Correct patient confirmed:  Yes    2. Allergies confirmed:  Yes    3. Patient taking antiplatelet medications:  Yes    4. Patient taking anticoagulants:  No    5. Correct procedure and side confirmed and consent signed:  Yes    6. Correct antibiotics confirmed:  Yes  Keflex today    [facility]  Without Video- Fluoroscopy    Name: Kristi Wallace    Date: 07/16/23      Kristi Wallace is a 77 y.o.  female who presents today for evaluation. Patient has been referred by Dr. Fran Lowes.  Dr. Esmeralda Arthur is available in clinic as the incident to provider.    The genital and perineal areas were cleansed with benzalkonium chloride and patient was catheterized with a 14 fr Straight-tip  catheter to assess PVR and absence of urinary tract infection.     Patient was found to have positive nitrites on UA and was rescheduled per UDS protocol. Message sent to provider and staff for awareness of new date and treatment options if needed once C&S is resulted.    New UDS date 07/26/23   Pt at facility pharmacy correct in chart for e -scribe        Procedure visit on 07/16/2023   Component Date Value    Color, Urine, POC 07/16/2023 Yellow     Clarity, Urine, POC 07/16/2023 Clear     Glucose, Urine, POC 07/16/2023 Negative     Bilirubin, Urine, POC 07/16/2023 Negative     Ketones, Urine, POC 07/16/2023 Negative     Specific Gravity, Urine,* 07/16/2023 1.020     Blood, Urine, POC 07/16/2023 1+     pH, Urine, POC 07/16/2023 6.0     Protein, Urine, POC 07/16/2023 Negative     Urobilinogen, POC 07/16/2023 0.2 mg/dL     Nitrite, Urine, POC 07/16/2023 Positive     Leukocyte Esterase, Urin* 07/16/2023 3+         Orders Placed This Encounter   Procedures    Urine C&S     Order Specific Question:   Specify all ANTIBIOTIC ALLERGIES:     Answer:   Sulfa (Bactrim)     Order Specific Question:   Specify the urine source     Answer:   Straight Cath In/Out    AMB POC  URINALYSIS DIP STICK AUTO W/O MICRO       Urine dipstick shows positive.         The Patient Instruction Sheet was reviewed with pt, and was understood.    Kristi Wallace L. Ellerie Arenz, LPN    Chart reviewed and agree  V Brugh

## 2023-07-18 NOTE — Telephone Encounter (Signed)
[facility] calling to advise they have  not received the fax for PT's new RX,    Fax number [phone]

## 2023-07-18 NOTE — Telephone Encounter (Signed)
Kristi Wallace called regarding prescription from recent visit  cbn: [phone] cell phone [phone]  fax number: [phone] would like a call back

## 2023-07-19 LAB — URINE C&S

## 2023-07-19 MED ORDER — CEPHALEXIN 500 MG PO CAPS
500 MG | ORAL_CAPSULE | Freq: Four times a day (QID) | ORAL | 0 refills | Status: DC
Start: 2023-07-19 — End: 2023-07-20

## 2023-07-19 NOTE — Telephone Encounter (Signed)
LVM for a call back. Patient Urine Culture have not been resulted yet.

## 2023-07-19 NOTE — Other (Signed)
Evidence of UTI   5 d keflex

## 2023-07-20 ENCOUNTER — Telehealth

## 2023-07-20 MED ORDER — CEPHALEXIN 500 MG PO CAPS
500 | ORAL_CAPSULE | Freq: Four times a day (QID) | ORAL | 0 refills | Status: DC
Start: 2023-07-20 — End: 2023-07-20

## 2023-07-20 MED ORDER — CEPHALEXIN 500 MG PO CAPS
500 | ORAL_CAPSULE | Freq: Four times a day (QID) | ORAL | 0 refills | Status: AC
Start: 2023-07-20 — End: 2023-07-25

## 2023-07-20 MED ORDER — CEPHALEXIN 500 MG PO CAPS
500 MG | ORAL_CAPSULE | Freq: Four times a day (QID) | ORAL | 0 refills | Status: DC
Start: 2023-07-20 — End: 2023-07-20

## 2023-07-20 NOTE — Telephone Encounter (Addendum)
Prescription, urine culture results, and Provider order have been faxed to [facility] at fax number [phone]       ----- Message from Maryan Char, MD sent at 07/19/2023  1:37 PM EDT -----  Evidence of UTI   5d keflex

## 2023-07-26 ENCOUNTER — Ambulatory Visit: Admit: 2023-07-26 | Discharge: 2023-07-26 | Payer: MEDICARE

## 2023-07-26 DIAGNOSIS — N390 Urinary tract infection, site not specified: Secondary | ICD-10-CM

## 2023-07-26 LAB — AMB POC URINALYSIS DIP STICK AUTO W/O MICRO
Bilirubin, Urine, POC: NEGATIVE
Glucose, Urine, POC: NEGATIVE
Ketones, Urine, POC: NEGATIVE
Leukocyte Esterase, Urine, POC: NEGATIVE
Nitrite, Urine, POC: NEGATIVE
Protein, Urine, POC: NEGATIVE
Specific Gravity, Urine, POC: 1.02 (ref 1.001–1.035)
Urobilinogen, POC: 0.2
pH, Urine, POC: 6.5 (ref 4.6–8.0)

## 2023-07-26 MED ORDER — CEPHALEXIN 500 MG PO CAPS
500 MG | ORAL_CAPSULE | Freq: Once | ORAL | 0 refills | Status: AC
Start: 2023-07-26 — End: 2023-07-26

## 2023-07-26 NOTE — Progress Notes (Addendum)
Triage note universal pause    Universal Procedure Pause:    1. Correct patient confirmed:  Yes    2. Allergies confirmed:  Yes    3. Patient taking antiplatelet medications:  No    4. Patient taking anticoagulants:  No    5. Correct procedure and side confirmed and consent signed:  Yes    6. Correct antibiotics confirmed:  Yes  Keflex today    [facility]  Without Video- Fluoroscopy    Name: Kristi Wallace    Date: 07/26/23        Guinivere Dercole is a 77 y.o.  female who presents today for evaluation. Patient has been referred by Dr. Fran Lowes.  Dr. Verdie Mosher is available in clinic as the incident to provider.    Upon arrival, the patient emptied Her bladder using a calibrated uroflow machine to assess bladder volume and time of emptying.       The genital and perineal areas were cleansed with benzalkonium chloride and patient was catheterized with a 12 fr straight catheter to assess PVR and absence of urinary tract infection.     A #7 fr straight urodynamics catheter was inserted into the bladder to infuse fluid and assess bladder activity and voiding parameters.  A #9 fr catheter was inserted into the rectum to assess straining during voiding. Both catheters were connected to water filled lines to transducers that simultaneously measure pressure generated inside and outside the bladder during filling and voiding as well as resulting flow volume and time.      Three patch electrodes are placed, one ground and two perianally which are used to measure the electrical activity of the external urinary sphincter during filling and emptying the bladder.      Infusion with sterile water was started and the patient was asked to voice sensations of pressure, urge and bladder fullness, followed by instructions to void.      Patient was also asked to strain and cough to assess urethral pressure changes during the filling phase.       Procedure visit on 07/26/2023   Component Date Value    Color, Urine, POC  07/26/2023 Yellow     Clarity, Urine, POC 07/26/2023 Clear     Glucose, Urine, POC 07/26/2023 Negative     Bilirubin, Urine, POC 07/26/2023 Negative     Ketones, Urine, POC 07/26/2023 Negative     Specific Gravity, Urine,* 07/26/2023 1.020     Blood, Urine, POC 07/26/2023 Trace     pH, Urine, POC 07/26/2023 6.5     Protein, Urine, POC 07/26/2023 Negative     Urobilinogen, POC 07/26/2023 0.2 mg/dL     Nitrite, Urine, POC 07/26/2023 Negative     Leukocyte Esterase, Urin* 07/26/2023 Negative         Orders Placed This Encounter   Procedures    AMB POC URINALYSIS DIP STICK AUTO W/O MICRO    PR COMPLEX CYSTOMETROGRAM VOIDING PRESSURE STUDIES    VOIDING PRESS STUDY INTRA-ABDOMINAL VOID    PR ANAL/URINARY MUSCLE STUDY    PR ELECTRO-UROFLOWMETRY, FIRST       Urine dipstick shows negative.    Voiding Diary: not done    Uroflometry  Comments: Pt voided with urgency no uroflow captured obtained clear yellow urine.      Cystometrogram  Sensation delayed   First sensation 98 ml   Strong desire 224 ml   Capacity 230 ml   Compliance: normal     Detrusor Overactivity:Yes Pressure:  14 cm H2O Volume: 230 ml   Leakage: Yes: Pressure 14 cm H20    Volume: 230 ml"     Stress Incontinence  Reduction of prolapse: no   Valsalva: Yes: Pressure 4 cm H20    Volume: 130 ml"  Type:drops   Cough: Yes: Pressure 70 cm H20    Volume: 130 ml"  Type:stream   Stress induced overactivity: Cough: no  Valsalva: no     Micturition  Voided Volume: 124 ml   Detrusor Pressure: At peak flow rate: 33 cm H2O Maximum: 40 cm H2O   Duration Contraction: 60sec continuous    Post Void Contraction: No    Valsalva: No   External Sphincter: unrelaxed   Max Flow Rate: 7.5 ml/sec   Average Flow Rate 3.2 ml/sec   Voiding Time: 1:10 minutes   Flow Pattern: intermittent   Post Void Residual:   Comments: Patient was given Keflex 500mg  PO per UDS protocol and is taking Vesicare 10mg  daily. Per CMG patient has a small capacity, compliant bladder with delayed  sensations and DO at capacity with leak. She did demonstrate SUI with Cough, drops, with LPP of 4cmH2O and Valsalva, drops, with a LPP of 70cmH2O. She voided with continuous high pressure detrusor contraction, low intermittent flow, no valsalva, unrelaxed EUS and moderate PVR.           Brief history (present illness, neurological, S/S): Other neurologic    Relevant surgical history: Not Applicable history   PE: Deferred    Cystoscopy: No    Patient mobility: Wheelchair    Is patient able to stand and pivot w/o assistance: No only w assistance   Does patient have a foley catheter? No    Does patient have a history of UTI's? No    Urine C&S done today? Yes    My clinical question is: leakage [ SUI vs uui ]        The Patient Instruction Sheet was reviewed with pt, and was understood.    Eitan Doubleday L. Khloey Chern, LPN      I was present in the office and have personally reviewed and agree with above assessment and plan.  Page Spiro, MD, FACS  07/27/2023    ADDENDUM:  I have read the above study, reviewed the tracings, the clinical history, and agree with the findings as above.   Study of sufficient quality: Yes   After review, my summary and recommendations are:     UDS suggestive of dysfunctional voiding with unrelaxed EUS, may benefit from PFPT.   SUI also demonstrated with cough and valsalva.     -Etheleen Nicks, MD

## 2023-08-02 ENCOUNTER — Encounter: Admit: 2023-08-02 | Discharge: 2023-08-02 | Payer: MEDICARE | Attending: Urology

## 2023-08-02 DIAGNOSIS — R32 Unspecified urinary incontinence: Secondary | ICD-10-CM

## 2023-08-02 NOTE — Progress Notes (Signed)
Kristi Wallace  1946/10/23    [facility] Human is following up today for a condition that will require long term care.         Chief Complaint   Patient presents with    Incontinence     Follow-up review UDS     No diagnosis found.        ASSESSMENT:    -UUI- failed Ditropan    UDS 07/26/23: Per [facility] patient has a small capacity, compliant bladder with delayed sensations and DO at capacity with leak. She did demonstrate SUI with Cough, drops, with LPP of 4cmH2O and Valsalva, drops, with a LPP of 70cmH2O. She voided with continuous high pressure detrusor contraction, low intermittent flow, no valsalva, unrelaxed EUS and moderate PVR.     Patient is elderly and there is a concern for cognitive issues on anticholinergics.   A non-anticholinergic OAB medication is best choice for this patient based on efficacy and risk for increased effect on cognitive function with anticholinergic use. BEERS criteria established by The [employer] Ger[employer] recommends specifically against use of drugs with strong anticholinergic properties in patients 65 and older.      - SUI noted on UDS 07/26/23       -Recurrent UTI   Ucx 01/06/22: >100K E. Coli PS   Ucx 02/27/22: 35K mixed flora  Ucx 06/09/22: 10K mixed  Ucx 02/18/23: 10K mixed   Ucx 03/29/23: >100K E. Coli PS  Ucx 04/13/23: >100K E. Coli PS  Ucx 07/05/23: No growth  Ucx 07/16/23: >100K Klebsiella pneumoniae, Ctrob. Amalonaticus, Enterobacter cloacae      PLAN:      Reviewed UDS from 07/26/23  Discussed PFPT, patient notes she lives in assisted living at [facility]. PT in house at assisted living. Patient will ask staff. Referral placed  Start Myrbetriq 25 mg daily, R/b reviewed, Rx sent to pharmacy. Patient will check BP 1 week after starting medication.     OK to refill medications as needed except pain medications and antibiotics  No follow-ups on file.    DISCUSSION: The patient has evidence of urinary stress incontinence. We discussed the normal voiding mechanics of the bladder  and urethra. The etiology of SUI was discussed in detail with the patient at todays visit.  We also discussed her options for management including observation, kegels, pessary or surgical intervention.  We also discussed the use of pessary. We discussed surgical interventions including sling placement (retropubic and transobturator) and periurethral injection therapy.  The risks and benefits of the procedures were discussed in detail. The patient was given a handout regarding the procedures.       HISTORY OF PRESENT ILLNESS:  Kristi Wallace is a 77 y.o. female with a hx of UUI and recurrent UTI who is seen in follow up to review UDS. UDS 07/26/23 showed per [facility] patient has a small capacity, compliant bladder with delayed sensations and DO at capacity with leak. She did demonstrate SUI with Cough, drops, with LPP of 4cmH2O and Valsalva, drops, with a LPP of 70cmH2O. She voided with continuous high pressure detrusor contraction, low intermittent flow, no valsalva, unrelaxed EUS and moderate PVR.     Patient is doing well today and presents with family. She presents in a wheelchair due to bone cancer.   When patient lived in [place], they suspected contamination of water and patient possibly exposed to toxins and chemicals.   She notes she is in a wheelchair because she is a fall risks.  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    UDS 07/26/23  Uroflometry  Comments: Pt voided with  urgency no uroflow captured obtained clear yellow urine.      Cystometrogram  Sensation delayed   First sensation 98 ml   Strong desire 224 ml   Capacity 230 ml   Compliance: normal     Detrusor Overactivity:Yes Pressure: 14 cm H2O Volume: 230 ml   Leakage: Yes: Pressure 14 cm H20    Volume: 230 ml"     Stress Incontinence  Reduction of prolapse: no   Valsalva: Yes: Pressure 4 cm H20    Volume: 130 ml"  Type:drops   Cough: Yes: Pressure 70 cm H20    Volume: 130 ml"  Type:stream   Stress induced overactivity: Cough: no  Valsalva: no     Micturition  Voided Volume: 124 ml   Detrusor Pressure: At peak flow rate: 33 cm H2O Maximum: 40 cm H2O   Duration Contraction: 60sec continuous    Post Void Contraction: No    Valsalva: No   External Sphincter: unrelaxed   Max Flow Rate: 7.5 ml/sec   Average Flow Rate 3.2 ml/sec   Voiding Time: 1:10 minutes   Flow Pattern: intermittent   Post Void Residual:   Comments: Patient was given Keflex 500mg  PO per UDS protocol and is taking Vesicare 10mg  daily. Per [facility] patient has a small capacity, compliant bladder with delayed sensations and DO at capacity with leak. She did demonstrate SUI with Cough, drops, with LPP of 4cmH2O and Valsalva, drops, with a LPP of 70cmH2O. She voided with continuous high pressure detrusor contraction, low intermittent flow, no valsalva, unrelaxed EUS and moderate PVR.        CT Results:  CT FACIAL BONES WO CONTRAST 01/10/2023    Narrative  EXAM: CT FACIAL W/O CONTRAST    CLINICAL INDICATION/HISTORY: Facial trauma, blunt    COMPARISON: CT face 4/23    TECHNIQUE: Axial imaging of the face reconstructed into sagittal and coronal planes.    All CT scans at this facility are performed using dose optimization technique as appropriate to a performed exam, to include automated exposure control, adjustment of the MA and/or kV according to patient size (including appropriate matching for site-specific examinations) or use of  iterative reconstruction  technique.    FINDINGS:    Orbits: No fracture.    Midface/Nasal: Slight deformity and asymmetry of proximal right nasal bones may be related to prior trauma. No overlying soft tissue swelling is seen. Please correlate with point tenderness.    Mandible and TMJ: No fracture or dislocation.    Skull base: No fracture.    Sinuses: Clear.    Intraorbital contents: Normal.    Visualized brain: Normal.    Soft tissues: Right frontal scalp and  periorbital soft tissue hematoma.    Impression  1. Slight deformity of proximal right nasal bone may be related to prior trauma. Please correlate with point tenderness.  2. Right frontal scalp and periorbital soft tissue hematoma.    Signed By: Lonna Cobb, MD on 01/10/2023 11:42 AM      [place] Results:  No results found for this or any previous visit from the past 3650 days.        PHYSICAL EXAMINATION:     Resp 18   Ht 1.549 m (5\' 1" )   Wt 105.2 kg (232 lb)   BMI 43.84 kg/m   Constitutional: Well developed, well-nourished female in no acute distress.   CV:  No peripheral swelling noted  Respiratory: No respiratory distress or difficulties  Abdomen:  Soft and nontender. No masses. No hepatosplenomegaly.   GU Female:  No CVA tenderness.     Skin:  Normal color. No evidence of jaundice.     Neuro/Psych:  Patient with appropriate affect.  Alert and oriented.    Lymphatic:   No enlargement of supraclavicular lymph nodes.        History reviewed. No pertinent past medical history.    History reviewed. No pertinent surgical history.    Social History     Tobacco Use    Smoking status: Never    Smokeless tobacco: Never   Vaping Use    Vaping status: Never Used       Allergies   Allergen Reactions    Sulfa Antibiotics Hives       History reviewed. No pertinent family history.    Current Outpatient Medications   Medication Sig Dispense Refill    amLODIPine (NORVASC) 5 MG tablet       citalopram (CELEXA) 40 MG tablet Take 1 tablet by mouth daily      vitamin D (CHOLECALCIFEROL) 25 MCG (1000  UT) TABS tablet Take 5 tablets by mouth daily      diclofenac (VOLTAREN) 50 MG EC tablet       docusate (COLACE, DULCOLAX) 100 MG CAPS Take 100 mg by mouth 2 times daily      Ferrous Sulfate (IRON PO) Take 1 tablet by mouth daily      hydroCHLOROthiazide (HYDRODIURIL) 25 MG tablet Take 1 tablet by mouth daily      levothyroxine (SYNTHROID) 25 MCG tablet       omeprazole (PRILOSEC) 20 MG delayed release capsule       pioglitazone (ACTOS) 30 MG tablet       rosuvastatin (CRESTOR) 10 MG tablet       traMADol (ULTRAM) 50 MG tablet       traZODone (DESYREL) 100 MG tablet       solifenacin (VESICARE) 10 MG tablet Take 1 tablet by mouth daily 120 tablet 0     No current facility-administered medications for this visit.         Review of Systems   The 14 point review of systems was completed and negative, except as noted in HPI           A copy of today's office visit with all pertinent imaging results and labs were sent to the referring physician.      Talmadge Coventry   [facility]   [address]   [place], [place] [address]   Phone: [phone]    Fax: [phone]      Medical documentation is provided with the assistance of Talmadge Coventry, medical scribe for [facility]  Shiron Whetsel on 08/02/2023

## 2023-08-08 MED ORDER — MIRABEGRON ER 25 MG PO TB24
25 | ORAL_TABLET | Freq: Every day | ORAL | 3 refills | Status: AC
Start: 2023-08-08 — End: ?

## 2023-08-14 ENCOUNTER — Inpatient Hospital Stay: Admit: 2023-08-15 | Payer: MEDICARE | Primary: Internal Medicine

## 2023-08-14 DIAGNOSIS — N390 Urinary tract infection, site not specified: Secondary | ICD-10-CM

## 2023-08-15 LAB — URINALYSIS
Bilirubin, Urine: NEGATIVE
Glucose, Ur: NEGATIVE mg/dL
Ketones, Urine: NEGATIVE mg/dL
Nitrite, Urine: POSITIVE — AB
Protein, Urine: NEGATIVE mg/dL
Specific Gravity, UA: 1.01 (ref 1.005–1.030)
Urobilinogen, Urine: 0.2 mg/dL (ref 0.0–1.0)
pH, Urine: 6 (ref 5.0–9.0)

## 2023-08-15 LAB — MICROSCOPIC URINALYSIS

## 2023-08-15 NOTE — Telephone Encounter (Signed)
 Returned call from patient's sister Margarita Sermons (HIPAA verified) regarding Myrbetriq 25 mg- this costs $250 and there is no way patient can afford this medication.Requesting for cheaper alternative.    Her tel: [phone]

## 2023-08-16 NOTE — Telephone Encounter (Signed)
 Returned patient sister phone call and informed her that I will send a message to Dr.Holder for an alternative medication. Once she responds with an alternative I will give her a call back.

## 2023-08-17 LAB — CULTURE, URINE: Isolate: >100000 — AB

## 2023-08-21 NOTE — Addendum Note (Signed)
 Addended by: Quincy Sheehan D on: 08/21/2023 10:04 AM     Modules accepted: Orders

## 2023-08-23 MED ORDER — SOLIFENACIN SUCCINATE 5 MG PO TABS
5 | ORAL_TABLET | Freq: Every day | ORAL | 5 refills | Status: DC
Start: 2023-08-23 — End: 2023-11-05

## 2023-08-23 NOTE — Addendum Note (Signed)
 Addended by: Quincy Sheehan D on: 08/23/2023 11:02 AM     Modules accepted: Orders

## 2023-08-27 NOTE — Telephone Encounter (Signed)
 Returned call from patient's sister Bonita Quin , re: Theodore Demark being so expensive, patient is unable to afford $250 for this medication.  She was advised that Dr. Fran Lowes sent over new script - solifenacin (vesicare) sent to [facility] pharmacy on 08/23/23.

## 2023-09-28 ENCOUNTER — Inpatient Hospital Stay: Admit: 2023-09-28 | Payer: MEDICARE

## 2023-09-28 DIAGNOSIS — I10 Essential (primary) hypertension: Secondary | ICD-10-CM

## 2023-09-28 LAB — VITAMIN D 25 HYDROXY: Vit D, 25-Hydroxy: 43.8 ng/mL (ref 30.0–100.0)

## 2023-09-28 LAB — TSH: TSH, High Sensitivity: 1.404 u[IU]/mL (ref 0.550–4.780)

## 2023-09-28 LAB — COMPREHENSIVE METABOLIC PANEL
ALT: 12 U/L (ref 10–49)
AST: 24 U/L (ref 0.0–33.9)
Albumin: 4 g/dL (ref 3.4–5.0)
Alkaline Phosphatase: 36 U/L — ABNORMAL LOW (ref 46–116)
Anion Gap: 7 mmol/L (ref 5–15)
BUN: 20 mg/dL (ref 9–23)
CO2: 23 meq/L (ref 20–31)
Calcium: 9.6 mg/dL (ref 8.7–10.4)
Chloride: 109 meq/L — ABNORMAL HIGH (ref 98–107)
Creatinine: 1.12 mg/dL — ABNORMAL HIGH (ref 0.55–1.02)
GFR African American: >60
GFR Non-African American: 50
Glucose: 86 mg/dL (ref 74–106)
Potassium: 4.7 meq/L (ref 3.5–5.1)
Sodium: 139 meq/L (ref 136–145)
Total Bilirubin: 0.5 mg/dL (ref 0.30–1.20)
Total Protein: 7.4 g/dL (ref 5.7–8.2)

## 2023-09-28 LAB — HEMOGLOBIN A1C: Hemoglobin A1C: 5.1 % (ref 3.8–5.6)

## 2023-09-28 LAB — LIPID PANEL
Chol/HDL Ratio: 3 {ratio} (ref 0.0–4.4)
Cholesterol, Total: 113 mg/dL (ref 0–199)
HDL: 38 mg/dL — ABNORMAL LOW (ref 40–60)
LDL Cholesterol: 50 mg/dL (ref 0–130)
Triglycerides: 125 mg/dL (ref 0–150)

## 2023-09-28 LAB — MAGNESIUM: Magnesium: 2.2 mg/dL (ref 1.6–2.6)

## 2023-10-21 ENCOUNTER — Inpatient Hospital Stay: Admit: 2023-10-23 | Payer: MEDICARE

## 2023-10-21 DIAGNOSIS — N390 Urinary tract infection, site not specified: Secondary | ICD-10-CM

## 2023-10-22 ENCOUNTER — Inpatient Hospital Stay: Payer: MEDICARE

## 2023-10-23 LAB — URINALYSIS
Bilirubin, Urine: NEGATIVE
Glucose, Ur: NEGATIVE mg/dL
Nitrite, Urine: NEGATIVE
Protein, Urine: 100 mg/dL — AB
Specific Gravity, UA: 1.025 (ref 1.005–1.030)
Urobilinogen, Urine: 0.2 mg/dL (ref 0.0–1.0)
pH, Urine: 6.5 (ref 5.0–9.0)

## 2023-10-23 LAB — MICROSCOPIC URINALYSIS

## 2023-10-24 LAB — CULTURE, URINE: Isolate: >100000 — AB

## 2023-11-05 ENCOUNTER — Ambulatory Visit
Admit: 2023-11-05 | Discharge: 2023-11-05 | Payer: MEDICARE | Attending: Urology | Admitting: Urology | Primary: Internal Medicine

## 2023-11-05 VITALS — Ht 61.0 in | Wt 225.0 lb

## 2023-11-05 DIAGNOSIS — N390 Urinary tract infection, site not specified: Principal | ICD-10-CM

## 2023-11-05 LAB — AMB POC PVR, MEAS,POST-VOID RES,US,NON-IMAGING: PVR, POC: 24 cc

## 2023-11-05 MED ORDER — SOLIFENACIN SUCCINATE 10 MG PO TABS
10 | ORAL_TABLET | Freq: Every day | ORAL | 3 refills | Status: DC
Start: 2023-11-05 — End: 2023-11-05

## 2023-11-05 MED ORDER — SOLIFENACIN SUCCINATE 10 MG PO TABS
10 | ORAL_TABLET | Freq: Every day | ORAL | 3 refills | Status: AC
Start: 2023-11-05 — End: ?

## 2023-11-05 NOTE — Progress Notes (Signed)
 Kristi Wallace  [birth year]    Kristi Wallace is following up today for a condition that will require long term care.         Chief Complaint   Patient presents with    Incontinence    Other     PT PHARMACY IS [facility] [place]  [phone]

## 2024-01-15 DIAGNOSIS — N390 Urinary tract infection, site not specified: Secondary | ICD-10-CM

## 2024-01-16 ENCOUNTER — Inpatient Hospital Stay: Admit: 2024-01-16 | Payer: MEDICARE | Primary: Internal Medicine

## 2024-01-16 DIAGNOSIS — E109 Type 1 diabetes mellitus without complications: Secondary | ICD-10-CM

## 2024-01-16 LAB — MICROSCOPIC URINALYSIS: WBC, UA: >50 /[HPF] — AB

## 2024-01-16 LAB — COMPREHENSIVE METABOLIC PANEL W/ REFLEX TO MG FOR LOW K
ALT: 11 U/L (ref 10–49)
AST: 21 U/L (ref 0.0–33.9)
Albumin: 3.9 g/dL (ref 3.4–5.0)
Alkaline Phosphatase: 33 U/L — ABNORMAL LOW (ref 46–116)
Anion Gap: 9 mmol/L (ref 5–15)
BUN: 19 mg/dL (ref 9–23)
CO2: 27 meq/L (ref 20–31)
Calcium: 9.2 mg/dL (ref 8.7–10.4)
Chloride: 103 meq/L (ref 98–107)
Creatinine: 1.16 mg/dL — ABNORMAL HIGH (ref 0.55–1.02)
Glucose: 95 mg/dL (ref 74–106)
Potassium: 4.4 meq/L (ref 3.5–5.1)
Sodium: 139 meq/L (ref 136–145)
Total Bilirubin: 0.4 mg/dL (ref 0.30–1.20)
Total Protein: 7.2 g/dL (ref 5.7–8.2)

## 2024-01-16 LAB — URINALYSIS
Bilirubin, Urine: NEGATIVE
Blood, Urine: NEGATIVE
Glucose, Ur: NEGATIVE mg/dL
Nitrite, Urine: NEGATIVE
Specific Gravity, UA: >=1.03 (ref 1.005–1.030)
Urobilinogen, Urine: 1 mg/dL (ref 0.0–1.0)
pH, Urine: 6 (ref 5.0–9.0)

## 2024-01-17 LAB — HEMOGLOBIN A1C: Hemoglobin A1C: 5.1 % (ref 3.8–5.6)

## 2024-01-18 LAB — CULTURE, URINE: Isolate: >100000 — AB

## 2024-01-31 ENCOUNTER — Encounter: Attending: Urology | Primary: Internal Medicine

## 2024-02-18 ENCOUNTER — Inpatient Hospital Stay: Admit: 2024-02-18 | Payer: MEDICARE | Primary: Internal Medicine

## 2024-02-18 DIAGNOSIS — N390 Urinary tract infection, site not specified: Secondary | ICD-10-CM

## 2024-02-18 LAB — URINALYSIS
Bilirubin, Urine: NEGATIVE
Blood, Urine: NEGATIVE
Glucose, Ur: NEGATIVE mg/dL
Ketones, Urine: NEGATIVE mg/dL
Nitrite, Urine: NEGATIVE
Protein, Urine: NEGATIVE mg/dL
Specific Gravity, UA: 1.02 (ref 1.005–1.030)
Urobilinogen, Urine: 0.2 mg/dL (ref 0.0–1.0)
pH, Urine: 5.5 (ref 5.0–9.0)

## 2024-02-18 LAB — MICROSCOPIC URINALYSIS

## 2024-02-20 LAB — CULTURE, URINE: Isolate: >100000 — AB

## 2024-03-09 ENCOUNTER — Inpatient Hospital Stay: Admit: 2024-03-11 | Payer: MEDICARE | Primary: Internal Medicine

## 2024-03-09 DIAGNOSIS — R059 Cough, unspecified: Secondary | ICD-10-CM

## 2024-03-11 LAB — COVID-19, FLU A/B, AND RSV COMBO
Influenza A PCR: NEGATIVE
Influenza B by PCR: NEGATIVE
RSV A/B PCR: NEGATIVE
SARS-CoV-2: NEGATIVE

## 2024-04-01 ENCOUNTER — Inpatient Hospital Stay: Admit: 2024-04-01 | Payer: MEDICARE | Primary: Internal Medicine

## 2024-04-01 DIAGNOSIS — E119 Type 2 diabetes mellitus without complications: Secondary | ICD-10-CM

## 2024-04-02 LAB — LIPID PANEL
Chol/HDL Ratio: 3.5 ratio (ref 0.0–4.4)
Cholesterol, Total: 106 mg/dL (ref 0–199)
HDL: 30 mg/dL — ABNORMAL LOW (ref 40–60)
LDL Cholesterol: 48 mg/dL (ref 0–130)
Triglycerides: 141 mg/dL (ref 0–150)

## 2024-04-02 LAB — COMPREHENSIVE METABOLIC PANEL
ALT: 8 U/L — ABNORMAL LOW (ref 10–49)
AST: 25 U/L (ref 0.0–33.9)
Albumin: 3.5 g/dL (ref 3.4–5.0)
Alkaline Phosphatase: 37 U/L — ABNORMAL LOW (ref 46–116)
Anion Gap: 11 mmol/L (ref 5–15)
BUN: 16 mg/dL (ref 9–23)
CO2: 23 meq/L (ref 20–31)
Calcium: 9.1 mg/dL (ref 8.7–10.4)
Chloride: 105 meq/L (ref 98–107)
Creatinine: 1.05 mg/dL — ABNORMAL HIGH (ref 0.55–1.02)
GFR African American: >60
GFR Non-African American: 54
Glucose: 77 mg/dL (ref 74–106)
Potassium: 4.8 meq/L (ref 3.5–5.1)
Sodium: 139 meq/L (ref 136–145)
Total Bilirubin: 0.4 mg/dL (ref 0.30–1.20)
Total Protein: 7.1 g/dL (ref 5.7–8.2)

## 2024-04-02 LAB — TSH: TSH, High Sensitivity: 1.072 u[IU]/mL (ref 0.550–4.780)

## 2024-04-02 LAB — MAGNESIUM: Magnesium: 2.4 mg/dL (ref 1.6–2.6)

## 2024-04-02 LAB — VITAMIN D 25 HYDROXY: Vit D, 25-Hydroxy: 47.3 ng/mL (ref 30.0–100.0)

## 2024-04-02 LAB — HEMOGLOBIN A1C: Hemoglobin A1C: 5.2 % (ref 3.8–5.6)

## 2024-05-21 ENCOUNTER — Encounter: Attending: Urology | Primary: Internal Medicine

## 2024-07-09 ENCOUNTER — Inpatient Hospital Stay: Admit: 2024-07-09 | Payer: MEDICARE | Primary: Internal Medicine

## 2024-07-10 LAB — BASIC METABOLIC PANEL
Anion Gap: 12 mmol/L (ref 5–15)
BUN: 17 mg/dL (ref 9–23)
CO2: 27 meq/L (ref 20–31)
Calcium: 9.8 mg/dL (ref 8.7–10.4)
Chloride: 103 meq/L (ref 98–107)
Creatinine: 1.18 mg/dL — ABNORMAL HIGH (ref 0.55–1.02)
GFR African American: 57
GFR Non-African American: 47
Glucose: 62 mg/dL — ABNORMAL LOW (ref 74–106)
Potassium: 4.4 meq/L (ref 3.5–5.1)
Sodium: 142 meq/L (ref 136–145)

## 2024-07-10 LAB — MAGNESIUM: Magnesium: 2.3 mg/dL (ref 1.6–2.6)

## 2024-07-10 LAB — FOLATE: Folate: 15.07 ng/mL (ref 5.38–24.00)

## 2024-07-10 LAB — VITAMIN B12: Vitamin B-12: 349 pg/mL (ref 211–911)

## 2024-07-27 ENCOUNTER — Inpatient Hospital Stay: Admit: 2024-07-28 | Payer: MEDICARE | Primary: Internal Medicine

## 2024-07-29 ENCOUNTER — Inpatient Hospital Stay: Admit: 2024-07-30 | Payer: MEDICARE | Attending: Internal Medicine | Primary: Internal Medicine

## 2024-07-29 LAB — URINALYSIS
Bilirubin, Urine: NEGATIVE
Glucose, Ur: NEGATIVE mg/dL
Ketones, Urine: NEGATIVE mg/dL
Nitrite, Urine: NEGATIVE
Protein, Urine: NEGATIVE mg/dL
Specific Gravity, UA: 1.01 (ref 1.005–1.030)
Urobilinogen, Urine: 0.2 EU/dl (ref 0.0–1.0)
pH, Urine: 7 (ref 5.0–9.0)

## 2024-07-29 LAB — MICROSCOPIC URINALYSIS

## 2024-07-30 LAB — URINALYSIS
Bilirubin, Urine: NEGATIVE
Glucose, Ur: NEGATIVE mg/dL
Ketones, Urine: NEGATIVE mg/dL
Nitrite, Urine: NEGATIVE
Protein, Urine: NEGATIVE mg/dL
Specific Gravity, UA: 1.01 (ref 1.005–1.030)
Urobilinogen, Urine: 1 EU/dl (ref 0.0–1.0)
pH, Urine: 6.5 (ref 5.0–9.0)

## 2024-07-30 LAB — MICROSCOPIC URINALYSIS

## 2024-07-31 LAB — CULTURE, URINE: Isolate: >100000 — AB

## 2024-09-12 ENCOUNTER — Inpatient Hospital Stay: Admit: 2024-09-12 | Payer: MEDICARE | Primary: Internal Medicine

## 2024-09-12 DIAGNOSIS — E039 Hypothyroidism, unspecified: Principal | ICD-10-CM

## 2024-09-13 LAB — COMPREHENSIVE METABOLIC PANEL
ALT: 14 U/L (ref 10–49)
AST: 17 U/L (ref 0.0–33.9)
Albumin: 3.3 g/dL — ABNORMAL LOW (ref 3.4–5.0)
Alkaline Phosphatase: 30 U/L — ABNORMAL LOW (ref 46–116)
Anion Gap: 9 mmol/L (ref 5–15)
BUN: 17 mg/dL (ref 9–23)
CO2: 26 meq/L (ref 20–31)
Calcium: 8.4 mg/dL — ABNORMAL LOW (ref 8.7–10.4)
Chloride: 106 meq/L (ref 98–107)
Creatinine: 1.16 mg/dL — ABNORMAL HIGH (ref 0.55–1.02)
GFR African American: 58
GFR Non-African American: 48
Glucose: 79 mg/dL (ref 74–106)
Potassium: 4 meq/L (ref 3.5–5.1)
Sodium: 141 meq/L (ref 136–145)
Total Bilirubin: 0.3 mg/dL (ref 0.30–1.20)
Total Protein: 6.4 g/dL (ref 5.7–8.2)

## 2024-09-13 LAB — HEMOGLOBIN A1C: Hemoglobin A1C: 5.4 % (ref 3.8–5.6)

## 2024-09-13 LAB — TSH: TSH, High Sensitivity: 1.24 u[IU]/mL (ref 0.55–4.78)

## 2024-09-13 LAB — MAGNESIUM: Magnesium: 2.5 mg/dL (ref 1.6–2.6)

## 2024-11-06 ENCOUNTER — Inpatient Hospital Stay: Admit: 2024-11-06 | Payer: MEDICARE | Primary: Internal Medicine

## 2024-11-06 DIAGNOSIS — N390 Urinary tract infection, site not specified: Principal | ICD-10-CM

## 2024-11-06 LAB — URINALYSIS
Bilirubin, Urine: NEGATIVE
Blood, Urine: NEGATIVE
Glucose, Ur: NEGATIVE mg/dL
Ketones, Urine: NEGATIVE mg/dL
Protein, Urine: NEGATIVE mg/dL

## 2024-11-06 LAB — MICROSCOPIC URINALYSIS: RBC, UA: NEGATIVE /HPF

## 2024-11-08 LAB — CULTURE, URINE: Isolate: >100000 — AB

## 2024-11-27 ENCOUNTER — Inpatient Hospital Stay: Admit: 2024-11-28 | Payer: MEDICARE | Primary: Internal Medicine

## 2024-11-27 DIAGNOSIS — N390 Urinary tract infection, site not specified: Principal | ICD-10-CM

## 2024-11-29 LAB — MICROSCOPIC URINALYSIS

## 2024-11-29 LAB — URINALYSIS
Bilirubin, Urine: NEGATIVE
Blood, Urine: NEGATIVE
Glucose, Ur: NEGATIVE mg/dL
Nitrite, Urine: NEGATIVE
Protein, Urine: 100 mg/dL — AB
Specific Gravity, UA: 1.015 (ref 1.005–1.030)
Urobilinogen, Urine: 1 EU/dl (ref 0.0–1.0)
pH, Urine: 8.5 (ref 5.0–9.0)

## 2024-11-30 LAB — CULTURE, URINE: Isolate: >100000 — AB

## 2024-12-31 ENCOUNTER — Inpatient Hospital Stay: Admit: 2024-12-31 | Payer: MEDICARE | Primary: Internal Medicine

## 2024-12-31 DIAGNOSIS — N3281 Overactive bladder: Principal | ICD-10-CM

## 2024-12-31 LAB — URINALYSIS
Bilirubin, Urine: NEGATIVE
Glucose, Ur: NEGATIVE mg/dL
Ketones, Urine: NEGATIVE mg/dL
Nitrite, Urine: POSITIVE — AB
Protein, Urine: NEGATIVE mg/dL
Specific Gravity, UA: <1.005 — ABNORMAL LOW (ref 1.005–1.030)
Urobilinogen, Urine: 0.2 EU/dl (ref 0.0–1.0)
pH, Urine: 6.5 (ref 5.0–9.0)

## 2024-12-31 LAB — MICROSCOPIC URINALYSIS

## 2025-01-02 LAB — CULTURE, URINE: Isolate: >100000 — AB
